# Patient Record
Sex: Male | Born: 1955 | Race: White | Hispanic: No | Marital: Married | Smoking: Former smoker
Health system: Southern US, Community
[De-identification: ages and names within clinical notes are randomized; demographics above are authoritative.]

## PROBLEM LIST (undated history)

## (undated) DIAGNOSIS — Z8781 Personal history of (healed) traumatic fracture: Secondary | ICD-10-CM

## (undated) DIAGNOSIS — I2510 Atherosclerotic heart disease of native coronary artery without angina pectoris: Secondary | ICD-10-CM

## (undated) DIAGNOSIS — K219 Gastro-esophageal reflux disease without esophagitis: Secondary | ICD-10-CM

## (undated) DIAGNOSIS — Z8719 Personal history of other diseases of the digestive system: Secondary | ICD-10-CM

## (undated) DIAGNOSIS — K760 Fatty (change of) liver, not elsewhere classified: Secondary | ICD-10-CM

## (undated) DIAGNOSIS — I10 Essential (primary) hypertension: Secondary | ICD-10-CM

## (undated) DIAGNOSIS — Z973 Presence of spectacles and contact lenses: Secondary | ICD-10-CM

## (undated) DIAGNOSIS — E7800 Pure hypercholesterolemia, unspecified: Secondary | ICD-10-CM

## (undated) DIAGNOSIS — N400 Enlarged prostate without lower urinary tract symptoms: Secondary | ICD-10-CM

## (undated) HISTORY — PX: TOTAL HIP REVISION: SHX763

## (undated) HISTORY — PX: HIP FRACTURE SURGERY: SHX118

## (undated) HISTORY — PX: COLONOSCOPY: SHX174

## (undated) HISTORY — PX: UPPER GI ENDOSCOPY: SHX6162

## (undated) HISTORY — PX: CORONARY ANGIOPLASTY WITH STENT PLACEMENT: SHX49

## (undated) HISTORY — PX: TOTAL HIP ARTHROPLASTY: SHX124

## (undated) HISTORY — PX: BONE GRAFT HIP ILIAC CREST: SUR159

## (undated) HISTORY — PX: APPENDECTOMY: SHX54

---
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
## 1995-11-08 DIAGNOSIS — I2510 Atherosclerotic heart disease of native coronary artery without angina pectoris: Secondary | ICD-10-CM

## 1995-11-08 HISTORY — DX: Atherosclerotic heart disease of native coronary artery without angina pectoris: I25.10

## 1999-03-13 ENCOUNTER — Emergency Department (HOSPITAL_COMMUNITY): Admission: EM | Admit: 1999-03-13 | Discharge: 1999-03-13 | Payer: Self-pay | Admitting: Emergency Medicine

## 1999-03-14 ENCOUNTER — Encounter: Payer: Self-pay | Admitting: Emergency Medicine

## 1999-04-05 ENCOUNTER — Encounter: Payer: Self-pay | Admitting: Pulmonary Disease

## 1999-04-05 ENCOUNTER — Ambulatory Visit (HOSPITAL_COMMUNITY): Admission: RE | Admit: 1999-04-05 | Discharge: 1999-04-05 | Payer: Self-pay | Admitting: Pulmonary Disease

## 1999-07-14 ENCOUNTER — Ambulatory Visit (HOSPITAL_COMMUNITY): Admission: RE | Admit: 1999-07-14 | Discharge: 1999-07-14 | Payer: Self-pay | Admitting: Pulmonary Disease

## 1999-07-14 ENCOUNTER — Encounter: Payer: Self-pay | Admitting: Pulmonary Disease

## 2006-07-18 ENCOUNTER — Inpatient Hospital Stay (HOSPITAL_COMMUNITY): Admission: RE | Admit: 2006-07-18 | Discharge: 2006-07-20 | Payer: Self-pay | Admitting: Orthopedic Surgery

## 2007-02-22 ENCOUNTER — Encounter: Admission: RE | Admit: 2007-02-22 | Discharge: 2007-02-22 | Payer: Self-pay | Admitting: Internal Medicine

## 2007-02-22 DIAGNOSIS — K760 Fatty (change of) liver, not elsewhere classified: Secondary | ICD-10-CM

## 2007-02-22 HISTORY — DX: Fatty (change of) liver, not elsewhere classified: K76.0

## 2007-07-24 ENCOUNTER — Encounter (INDEPENDENT_AMBULATORY_CARE_PROVIDER_SITE_OTHER): Payer: Self-pay | Admitting: Orthopedic Surgery

## 2007-07-24 ENCOUNTER — Inpatient Hospital Stay (HOSPITAL_COMMUNITY): Admission: RE | Admit: 2007-07-24 | Discharge: 2007-07-27 | Payer: Self-pay | Admitting: Orthopedic Surgery

## 2007-10-05 ENCOUNTER — Encounter: Admission: RE | Admit: 2007-10-05 | Discharge: 2007-10-05 | Payer: Self-pay | Admitting: Orthopedic Surgery

## 2008-03-20 ENCOUNTER — Encounter: Admission: RE | Admit: 2008-03-20 | Discharge: 2008-03-20 | Payer: Self-pay | Admitting: Orthopedic Surgery

## 2008-04-22 ENCOUNTER — Encounter: Admission: RE | Admit: 2008-04-22 | Discharge: 2008-04-22 | Payer: Self-pay | Admitting: Orthopedic Surgery

## 2009-07-16 ENCOUNTER — Encounter: Admission: RE | Admit: 2009-07-16 | Discharge: 2009-07-16 | Payer: Self-pay | Admitting: Orthopedic Surgery

## 2009-07-23 ENCOUNTER — Ambulatory Visit (HOSPITAL_COMMUNITY): Admission: RE | Admit: 2009-07-23 | Discharge: 2009-07-23 | Payer: Self-pay | Admitting: Orthopedic Surgery

## 2010-05-27 ENCOUNTER — Ambulatory Visit (HOSPITAL_COMMUNITY): Admission: RE | Admit: 2010-05-27 | Discharge: 2010-05-27 | Payer: Self-pay | Admitting: Orthopedic Surgery

## 2010-09-17 IMAGING — NM NM BONE 3 PHASE
1 series · 6 of 6 positions shown · non-contrast
Comparison: Prior nuclear medicine whole-body bone scan 07/23/2009
and prior CT scan 07/16/2009.

CLINICAL DATA: Evaluate right femur fracture.

NUCLEAR MEDICINE THREE PHASE BONE SCAN
TECHNIQUE: Radionuclide angiographic images, immediate static
blood pool images, and 3-hour delayed static images were obtained
after intravenous injection of radiopharmaceutical.
Radiopharmaceutical: 24 mCi technetium MDP.

[Series 1: fl flow and static · 4.75mm/px · 6 of 40 frames shown]
[frame 4/40]
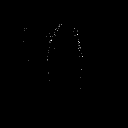
[frame 10/40  full-range]
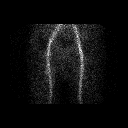
[frame 17/40  full-range]
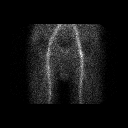
[frame 24/40  full-range]
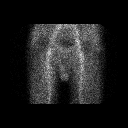
[frame 30/40  full-range]
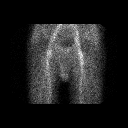
[frame 37/40  full-range]
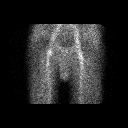

[6 of 6 positions shown; findings below may reference images not displayed]

FINDINGS: No increased blood flow is seen to the right hip/ lower
extremity.  The delayed images demonstrate stable mild increased
activity around the long stem femoral prosthesis and cerclage
wires.  Distally was also stable activity near the tip of the
prosthesis.  These all likely ongoing stress-related/remodeling
findings.  No discrete fracture.
IMPRESSION: Stable three-phase bone scan.  No findings to suggest infection.
Likely ongoing stress-related/remodeling changes.

## 2010-12-16 ENCOUNTER — Other Ambulatory Visit: Payer: Self-pay | Admitting: Gastroenterology

## 2011-03-22 NOTE — Discharge Summary (Signed)
NAME:  James Koch, James Koch               ACCOUNT NO.:  [id number]   MEDICAL RECORD NO.:  [id number]          PATIENT TYPE:  INP   LOCATION:  1612                         FACILITY:  [facility]   PHYSICIAN:  Deidre Ala, M.D.    DATE OF BIRTH:  1956-07-10   DATE OF ADMISSION:  07/24/2007  DATE OF DISCHARGE:  07/27/2007                               DISCHARGE SUMMARY   FINAL DIAGNOSES:  1. Osteoporosis, right hip.  2. Avascular necrosis, right hip.  3. Hyperlipidemia.  4. Hypertension.  5. Coronary artery disease.   PROCEDURES:  Total hip arthroplasty after removal of ORIF hardware.   HISTORY:  This is a 55 year old white male with history of genetic  osteoporosis.  He had a fracture of his right hip [place] and underwent ORIF of this.  He since has developed AVN of the  right hip and subsequently needed a total hip arthroplasty.  He  subsequently was scheduled for this.  We scheduled him for removal of  the old hardware from the ORIF and then total hip arthroplasty with an  SROM DePuy right hip prosthesis.   HOSPITAL COURSE:  Patient was admitted on July 24, 2007, and at  that time, underwent total hip arthroplasty with removal of the old  hardware from his ORIF.  The patient tolerated the procedure well, no  intraoperative complications.  Postoperatively, the patient did well, no  untoward event occurred during his stay.  He did have blood-loss anemia  noted.  He remained afebrile.  He progressed through therapy  excellently, without any difficulties.  He had no problems with this.  His incision remained clean and dry, no sign of infection.  He had no  signs for DVT.  He did finally require some blood products.  He received  two units of packed red cell prior to his discharge because of his  hemoglobin, which was 7.4, with a hematocrit of 20.9 on the date of  discharge.  He was doing well otherwise, tolerating this for the most  part.  He had a little bit of  light-headedness.  He was not tachycardic.  He was otherwise stable.  He subsequently received his two units of  packed red cells and was ready for discharge.   At the time of discharge, patient will continue on his home medications  he was taking prior to admission.  He was on:   1. Micardis 20 mg every other day.  2. Prilosec over-the-counter one daily.  3. Crestor 5 mg daily.  4. Forteo one injection daily.  5. He would also continue on Lovenox 40 mg subcu daily for the next      ten days.  6. He was given Percocet 5/325 one or two p.o. q. 4-6 h. p.r.n. for      pain, #50 of these without refills.   Patient has ALLERGY TO IBUPROFEN, which causes hives.   He was subsequently discharged on the afternoon of September 19 to home,  in satisfactory stable condition.  He will follow with Dr. Renae Fickle in 12  days.  James Koch, P.A.    ______________________________  James Koch, M.D.    CL/MEDQ  D:  07/27/2007  T:  07/27/2007  Job:  [id number]

## 2011-03-22 NOTE — Op Note (Signed)
NAME:  ARSHDEEP, BOLGER               ACCOUNT NO.:  [id number]   MEDICAL RECORD NO.:  [id number]          PATIENT TYPE:  INP   LOCATION:  NA                           FACILITY:  [facility]   PHYSICIAN:  Deidre Ala, M.D.    DATE OF BIRTH:  Feb 19, 1956   DATE OF PROCEDURE:  07/24/2007  DATE OF DISCHARGE:                               OPERATIVE REPORT   PREOPERATIVE DIAGNOSIS:  Status post prophylactic screw and side plate  with two Ace cannulated screws for hereditary osteoporosis severe and  femoral neck stress fracture healed, now with avascular necrosis of the  right femoral head and crescent sign.   POSTOPERATIVE DIAGNOSIS:  Status post prophylactic screw and side plate  with two Ace cannulated screws for hereditary osteoporosis severe and  femoral neck stress fracture healed, now with avascular necrosis of the  right femoral head and crescent sign.   PROCEDURES:  1. Right hip conversion of previous surgery to total hip arthroplasty      with removal of Ace DHS screw and side plate and two Ace cannulated      screws.  2. Right total hip arthroplasty using SROM total hip with metal-on-      metal acetabulum DePuy type cemented with long revision stem.   SURGEON:  1. Charlesetta Shanks, M.D.   ASSISTANT:  Phineas Semen, P.A.C.   ANESTHESIA:  General endotracheal.   CULTURES:  None.   DRAINS:  Two medium Hemovacs to self-suction.   ESTIMATED BLOOD LOSS:  1000 mL.   REPLACED:  Without.   PATHOLOGIC FINDINGS AND HISTORY:  The patient was sent by Janae Bridgeman.  Lendell Caprice, M.D., a year ago for right hip pain following walking on the  beach with the production of a vertical nondisplaced right femoral neck  stress fracture, who was prematurely osteopenic with familial  osteopenia.  He was taken to the operating room where the construct was  a DePuy compression screw and side plate, four-hole 130 degree angle  with two additional Ace 6.5 cannulated cancellous screws.  The patient  initially  healed uneventfully, but ultimately began to develop groin  pain.  He had some stress reaction at the distal plate but his pain was  basically 70% in the groin and about 30% down the leg which he could  live with.  It did not remit so we got an MRI scan which showed an area  of large AVN anteriorly with an area of old AVN more posteriorly with  acute edema.  There was also a crescent sign on the right hip.  There  was early AVN in the left hip.  Due to intractability of his pain, the  patient elected to proceed with total hip arthroplasty with metal  removal.  At surgery, we did remove the screw and side plate through the  same hip incision, but through a different incision more posterior to  the original screw placement more anterior on the femur.  We did  encounter some difficulty taking out the lag screw as it caught on the  sclerotic bone in the inferior calcar, and we stripped  the key mechanism  on the proximal screw, so we had to use a 14 mm trephine to remove it.  We also then removed two Ace cannulated cancellous screws as well as the  four screws on the side plate.  The actual total hip sizes were that we  reamed to a 56 acetabular cup.  We used the ASR all metal cup. Our SROM  proximal sleeve was an 18B large, the stem was an 18 x 13 x 215, 36  standard neck +8 lateral offset.  The femoral head was a 49 ball and the  taper sleeve adapter was a +9.  This gave [place] the best stability with no  push-pull instability, external rotation and extension stable and  flexion to almost 90 degrees with internal rotation to 80-85 degrees  before the prosthesis perched and came out.  Leg lengths were roughly  equal at measurement on the table.  Therefore this was metal-on-metal.  We did the long stem femoral stem with clothes pin tip in order to go  past the area of the old side plate where there was somewhat of a stress  reaction and also passed a stress risers there to protect him from   fracture as well.  We used a long stem in order to also bypass with  stress down into the femur with a clothes pin tip so that it would  decrease the rigidity of the tip of the stem and prevent thigh pain or  stress reaction distal.  This was all decided preoperatively and the  patient had agreed with the concept and the procedure.   PROCEDURE:  With adequate anesthesia obtained using endotracheal  technique, 1 gram Ancef given IV prophylaxis and another one at implant,  the patient was placed in the left lateral decubitus position with the  right side up.  After standard prepping and draping, we made an Romilda Joy incision proximally curving up over the trochanter and then down  along the lateral trochanter about 2 inches posterior to the original  hip incision.  We went down on the femur with a long enough length to  get the side plate out.  Incision was deepened sharply with a knife and  hemostasis obtained using the Bovie electrocoagulator.  We then  dissected through the iliotibial band and placed the Charnley retractor.  I then found the side plate and incised down the vastus lateralis,  exposed the screws, removed all the screws under power and the  compression screw as well as the plate.  I then made the attempt to  remove the lag screw.  It came out about a centimeter before it caught.  We tried various methods thereafter after stripping the key mechanism  for the screwdriver and then used a 14 mm trephine from the [facility]  instrument set, cut over it, removed the screw.  We then removed both  Ace cancellous screws with a screwdriver and pliers for the posterior  one because we had caught it in the edge of the trephine and part of the  head was removed, this was removed but we had a flat spot that we could  used to pull the screw out.  With all metal removed, irrigation was  carried out.  The screw holes were debrided and then we closed the  vastus lateralis with running  locking #1 PDS.  Attention was then turned  back to the hip where retractors were placed.  The piriformis was  released and tagged  as well as the capsule, peeling it back medially.  We then dislocated the femoral head, cleared the posterior neck and then  placed retractors again.  The template was then used to cut the neck cut  horizontal using the center of the head as the base for the SROM and  made that cut and then cut out the anterior trochanter.  We then exposed  the acetabulum, used wing retractors and various other retractors with  doing a labrectomy and ultimately removed the vascular pannus within the  oval window.  I then reamed up to a 55 with good bleeding subcortical  bone exposed with the appropriate geometry with anteversion and  abduction at about 45 degrees.  We then sized to a 56.  We then  implanted the ASR cup with a good tight fit in the appropriate geometry  and impacted it down.  We then exposed the proximal femur.  We did the  lateralizing canal finder and trochanter reamer.  We then sequentially  reamed up with flexible reamers past the isthmus, reaming up to a 14.5  for the 13 stem.  We then placed the conical reamer and reamed that to  an 18B large to the index 0.42 mm just passed the tip of the trochanter  even though it was a 36 length.  We sank it down just a little bit for  clearance.  We then did the proximal sleeve ream, again fitting an 18B  large and placed the proximal sleeve trial in place as well as the long  stem prosthesis trial.  We then articulated the hip, first trying a 0  head, then a +6 and ultimately a +9 in the appropriate anteversion.  At  this point, we then implanted the implantable proximal sleeve, 18B large  in the appropriate position and impacted that.  We then placed down the  prosthesis in the appropriate anteversion about 15 degrees with a long  stem with the actual stem prosthesis and impacted that.  We then trialed  again with a  +6 head.  I felt like the +9 was better and so impacted  that on and articulated the hip through the range of motion with the  stability findings as listed above.  C-arm fluoroscopy was then used to  confirm the overall position of the stem, bypassing the distal tip of  the plate and the geometry of the proximal femur and the acetabulum.  When we were satisfied with the position, irrigation was carried out.  FloSeal was placed in the base of the wound.  We then repaired the  posterior capsule back with Ethibond sutures to the gluteus muscles and  proximal trochanter as well as the piriformis.  The Hemovac drains were  placed in the medial and lateral gutters and brought out distally.  The  wound was then closed on the iliotibial band and gluteus fascia with  interrupted figure-of-eight #1 Vicryl sutures up to the gluteus fascia  then it was run.  We then closed the subcu with 0-0 and 2-0 subcuticular  running stitches and the skin with staples.  We had placed FloSeal on  the wound prior to closure.  The Hemovacs were hooked up to self-  suction.  I made one stab hole to remove the lower screw on the plate.  We used that as one of the drain holes and further repaired it with  staples.  A bulky sterile compressive dressing was applied with Aquacel  dressing and the patient had  a knee immobilizer placed, was turned into  the supine position and taken to the PACU in satisfactory condition as  per routine postoperative management.           ______________________________  V. Charlesetta Shanks, M.D.     VEP/MEDQ  D:  07/24/2007  T:  07/24/2007  Job:  [id number]   cc:   Janae Bridgeman. Eloise Harman., M.D.  Fax: [phone]

## 2011-03-25 NOTE — Op Note (Signed)
NAME:  James Koch, James Koch               ACCOUNT NO.:  [id number]   MEDICAL RECORD NO.:  [id number]          PATIENT TYPE:  INP   LOCATION:  0010                         FACILITY:  [facility]   PHYSICIAN:  Deidre Ala, M.D.    DATE OF BIRTH:  May 11, 1956   DATE OF PROCEDURE:  07/18/2006  DATE OF DISCHARGE:                                 OPERATIVE REPORT   PREOPERATIVE DIAGNOSIS:  Vertical nondisplaced right hip femoral neck stress  fracture in a prematurely osteopenic male.   POSTOPERATIVE DIAGNOSIS:  Vertical nondisplaced right hip femoral neck  stress fracture in a prematurely osteopenic male.   OPERATION PERFORMED:  1. Open reduction internal fixation of vertical stress fracture, right      femoral neck using DePuy compression screw and side plate 4-hole 366      degree angle with two additional Ace 6.5 cannulated cancellous screws.  2. Use of intraoperative fluoroscopy.   SURGEON:  1. Charlesetta Shanks, M.D.   ASSISTANT:  Clarene Reamer, P.A.-C.   ANESTHESIA:  General endotracheal.   CULTURES:  None.   DRAINS:  None.   ESTIMATED BLOOD LOSS:  600 mL.   FLUIDS REPLACED:  Without.   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  He had a heart attack in his 30s.  He is a two-pack per day  smoker up until seven years ago and he has been relatively sedentary as an  Art gallery manager but walked at [employer], noticed hip pain.  He saw his regular  medical doctor, Janae Bridgeman. James Koch, M.D., who got an MRI scan showing a  vertical nondisplaced femoral neck fracture.  We then did a Dexascan on his  opposite hip in the office and it showed high risk for hip fracture.  Therefore, I felt he was out of the range for a in situ pinning, given the  fact that there is high incidence of fracture subpins, so we used a spanning  DHS type screw and side plate in the inferior head and neck as well as two  more superiorly placed Ace cannulated cancellous screws for fixation.   We  achieved our goal of the placement of the screws.   DESCRIPTION OF PROCEDURE:  With adequate anesthesia obtained using  endotracheal technique, 1 g Ancef given IV prophylaxis, the patient was  placed in the supine position on the [place] fracture table with the hip in  the internally rotated position, the left hip up in a stirrup.  Scout films  were taken.  After standard prepping and draping, we placed a guide pin in  the inferior slightly posterior head and neck with a DHS short thread screw.  We then measured the length of 100 mm and then tapped it, then placed a  cannulator step cut drill before we had done the tapping.  We then placed  the 100 mm screw and side plate, 440 degree angle.  Placed three bicortical  screws and one unicortical screw to fix the plate to the femur and then the  compression screw was put in place and left.  C-arm fluoroscopy confirmed  positioning.  We then placed two additional 6.5 cannulated cancellous screws  in the superior slightly divergent head and neck, with divertent position to  achieve geometric stability.  We did used a keyed type system on the  compression screw.  C-arm fluoroscopy confirmed positioning and we felt we  were satisfactorily across the fracture configuration.  It should be noted  that we did make an incision laterally.  Dissecting down through the vastus  lateralis and iliotibial band, placing retractors and then placing the screw  and side plate and the additional screws.  Irrigation was carried out, there  was an arterial pumper distally that we did finally cauterize and got good  hemostasis ultimately using some additional Gelfoam with Thrombin.  The  wound was then closed in layers with #1 running locking PDS on vastus  lateralis and iliotibial band, 0 and 2-0 Vicryl on the skin and skin  staples.  Bulky sterile compressive dressing was applied with adhesive  bandage.  The patient having tolerated the procedure well was  awakened and  taken to recovery room in satisfactory condition for routine postoperative  care.           ______________________________  V. Charlesetta Shanks, M.D.     VEP/MEDQ  D:  07/18/2006  T:  07/19/2006  Job:  [id number]   cc:   Janae Bridgeman. Eloise Harman., M.D.  Fax: [phone]

## 2011-08-18 LAB — CBC
HCT: 20.9 — ABNORMAL LOW
Hemoglobin: 7.4 — CL
MCV: 88.8
MCV: 89.2
MCV: 89.8
Platelets: 187
Platelets: 221
RBC: 2.33 — ABNORMAL LOW
RBC: 2.49 — ABNORMAL LOW
WBC: 10.2
WBC: 7.9
WBC: 9.4

## 2011-08-18 LAB — TYPE AND SCREEN
ABO/RH(D): A POS
Antibody Screen: NEGATIVE

## 2011-08-18 LAB — BASIC METABOLIC PANEL
CO2: 26
CO2: 28
Calcium: 8.1 — ABNORMAL LOW
Calcium: 8.6
Creatinine, Ser: 1.16
Sodium: 134 — ABNORMAL LOW

## 2011-08-18 LAB — HEMOGLOBIN AND HEMATOCRIT, BLOOD
HCT: 30.3 — ABNORMAL LOW
Hemoglobin: 10.6 — ABNORMAL LOW

## 2011-08-19 LAB — COMPREHENSIVE METABOLIC PANEL
ALT: 52
CO2: 26
Calcium: 9.9
Chloride: 105
Creatinine, Ser: 0.86
GFR calc non Af Amer: >60
Glucose, Bld: 105 — ABNORMAL HIGH
Total Bilirubin: 0.7

## 2011-08-19 LAB — DIFFERENTIAL
Basophils Absolute: 0
Basophils Relative: 1
Eosinophils Relative: 6 — ABNORMAL HIGH
Lymphocytes Relative: 16
Monocytes Absolute: 0.6
Neutro Abs: 4.2

## 2011-08-19 LAB — URINALYSIS, ROUTINE W REFLEX MICROSCOPIC
Bilirubin Urine: NEGATIVE
Glucose, UA: NEGATIVE
Specific Gravity, Urine: 1.017
pH: 6.5

## 2011-08-19 LAB — CBC
HCT: 41.8
MCV: 88.1
RBC: 4.74
WBC: 6.1

## 2011-08-19 LAB — APTT: aPTT: 25

## 2011-08-19 LAB — URINE MICROSCOPIC-ADD ON

## 2012-02-07 ENCOUNTER — Other Ambulatory Visit: Payer: Self-pay | Admitting: Occupational Medicine

## 2012-02-07 ENCOUNTER — Ambulatory Visit: Payer: Self-pay

## 2012-02-07 DIAGNOSIS — Z0000 Encounter for general adult medical examination without abnormal findings: Secondary | ICD-10-CM

## 2015-03-16 ENCOUNTER — Emergency Department (INDEPENDENT_AMBULATORY_CARE_PROVIDER_SITE_OTHER): Payer: Managed Care, Other (non HMO)

## 2015-03-16 ENCOUNTER — Encounter (HOSPITAL_COMMUNITY): Payer: Self-pay | Admitting: Emergency Medicine

## 2015-03-16 ENCOUNTER — Emergency Department (HOSPITAL_COMMUNITY)
Admission: EM | Admit: 2015-03-16 | Discharge: 2015-03-16 | Disposition: A | Discharge status: Home / Self Care | Payer: Managed Care, Other (non HMO) | Source: Home / Self Care | Attending: Family Medicine | Admitting: Family Medicine

## 2015-03-16 DIAGNOSIS — S61219A Laceration without foreign body of unspecified finger without damage to nail, initial encounter: Secondary | ICD-10-CM

## 2015-03-16 DIAGNOSIS — Z23 Encounter for immunization: Secondary | ICD-10-CM | POA: Diagnosis not present

## 2015-03-16 HISTORY — DX: Essential (primary) hypertension: I10

## 2015-03-16 HISTORY — DX: Pure hypercholesterolemia, unspecified: E78.00

## 2015-03-16 MED ORDER — CEPHALEXIN 500 MG PO CAPS: 500.0000 mg | ORAL_CAPSULE | Freq: Three times a day (TID) | ORAL | Status: DC

## 2015-03-16 MED ORDER — TETANUS-DIPHTH-ACELL PERTUSSIS 5-2.5-18.5 LF-MCG/0.5 IM SUSP
0.5000 mL | Freq: Once | INTRAMUSCULAR | Status: AC
  Administered 2015-03-16: 0.5 mL via INTRAMUSCULAR

## 2015-03-16 MED ORDER — TETANUS-DIPHTH-ACELL PERTUSSIS 5-2.5-18.5 LF-MCG/0.5 IM SUSP
INTRAMUSCULAR | Status: AC
  Filled 2015-03-16: qty 0.5

## 2015-03-16 NOTE — ED Provider Notes (Addendum)
James Koch is a 59 y.o. male who presents to Urgent Care today for finger laceration. Patient was in his normal state of health today when he suffered a laceration to the tips of his left third and fourth digits with a table saw. He notes bleeding from the fourth digit. He cannot recall his last tetanus vaccine. No fevers or chills nausea vomiting or diarrhea. He has tried compression a helped control the bleeding.   Past Medical History  Diagnosis Date  . Hypertension   . High cholesterol    Past Surgical History  Procedure Laterality Date  . Appendectomy    . Coronary angioplasty with stent placement     History  Substance Use Topics  . Smoking status: Never Smoker   . Smokeless tobacco: Not on file  . Alcohol Use: No   ROS as above Medications: No current facility-administered medications for this encounter.   Current Outpatient Prescriptions  Medication Sig Dispense Refill  . ezetimibe-simvastatin (VYTORIN) 10-40 MG per tablet Take 1 tablet by mouth daily.    Marland Kitchen losartan (COZAAR) 50 MG tablet Take 50 mg by mouth daily.    . cephALEXin (KEFLEX) 500 MG capsule Take 1 capsule (500 mg total) by mouth 3 (three) times daily. 21 capsule 0   No Known Allergies   Exam:  BP 144/92 mmHg  Pulse 93  Temp(Src) 98.2 F (36.8 C) (Oral)  Resp 16  SpO2 96%  Gen: Well NAD Left hand: Laceration to the fourth and third digits distally. The laceration involves the fourth ulnar nail and the tip of the finger. No bone fragments are visible. The third digit laceration is extremely superficial and does not penetrate through the dermis. Motion capillary refill and sensation are intact distally.  Laceration repair:  Consent obtained and timeout performed. The volar fourth MCP was cleaned with Betadine and 4 mL of lidocaine without epinephrine were injected to achieve a good digital block. The wound was copiously irrigated with sure cleanse solution A tourniquet was applied and the entire  finger was cleaned with Betadine The area was draped in the usual sterile fashion. 1 horizontal mattress suture and 2 small interrupted sutures were used to close the wound and achieve good hemostasis. 4-0 Prolene was used Tourniquet removed total tourniquet time was less than 5 minutes. Slight oozing from the wound without brisk bleeding. Betadine removed and a dressing applied.  No results found for this or any previous visit (from the past 24 hour(s)). Dg Finger Ring Left  03/16/2015   CLINICAL DATA:  Cup the tip of his ring finger with a table saw while making a cabinet for is wife.  EXAM: LEFT RING FINGER 2+V  COMPARISON:  None.  FINDINGS: Irregularity of the soft tissues of the distal tip of the ring finger. There is also a small, shallow defect in the distal aspect of the fourth distal tuft. There is a tiny bone fragment at that location dorsally.  IMPRESSION: Laceration of the distal aspect of the left ring finger with a minimal fracture of the underlying fourth distal tuft.   Electronically Signed   By: Claudie Revering M.D.   On: 03/16/2015 20:31    Assessment and Plan: 59 y.o. male with finger laceration and open fracture. Laceration repaired with Prolene. Will treat with Keflex antibiotics and provide tetanus vaccination prior to discharge. Return in one week for suture removal.  Discussed warning signs or symptoms. Please see discharge instructions. Patient expresses understanding.     Rebekah Chesterfield  Georgina Snell, MD 03/16/15 2039  Gregor Hams, MD 03/16/15 [phone]

## 2015-03-16 NOTE — ED Notes (Signed)
Cut fingers with table saw around 6:00 pm.  Cut to finger tip of left middle finger, left ring finger tip.  Continues bleeding.

## 2015-03-16 NOTE — Discharge Instructions (Signed)
Thank you for coming in today. Keep the wound covered with ointment.  Return in 1 week for suture removal.  Take Keflex 3 times daily for one week Laceration Care, Adult A laceration is a cut or lesion that goes through all layers of the skin and into the tissue just beneath the skin. TREATMENT  Some lacerations may not require closure. Some lacerations may not be able to be closed due to an increased risk of infection. It is important to see your caregiver as soon as possible after an injury to minimize the risk of infection and maximize the opportunity for successful closure. If closure is appropriate, pain medicines may be given, if needed. The wound will be cleaned to help prevent infection. Your caregiver will use stitches (sutures), staples, wound glue (adhesive), or skin adhesive strips to repair the laceration. These tools bring the skin edges together to allow for faster healing and a better cosmetic outcome. However, all wounds will heal with a scar. Once the wound has healed, scarring can be minimized by covering the wound with sunscreen during the day for 1 full year. HOME CARE INSTRUCTIONS  For sutures or staples:  Keep the wound clean and dry.  If you were given a bandage (dressing), you should change it at least once a day. Also, change the dressing if it becomes wet or dirty, or as directed by your caregiver.  Wash the wound with soap and water 2 times a day. Rinse the wound off with water to remove all soap. Pat the wound dry with a clean towel.  After cleaning, apply a thin layer of the antibiotic ointment as recommended by your caregiver. This will help prevent infection and keep the dressing from sticking.  You may shower as usual after the first 24 hours. Do not soak the wound in water until the sutures are removed.  Only take over-the-counter or prescription medicines for pain, discomfort, or fever as directed by your caregiver.  Get your sutures or staples removed as  directed by your caregiver. For skin adhesive strips:  Keep the wound clean and dry.  Do not get the skin adhesive strips wet. You may bathe carefully, using caution to keep the wound dry.  If the wound gets wet, pat it dry with a clean towel.  Skin adhesive strips will fall off on their own. You may trim the strips as the wound heals. Do not remove skin adhesive strips that are still stuck to the wound. They will fall off in time. For wound adhesive:  You may briefly wet your wound in the shower or bath. Do not soak or scrub the wound. Do not swim. Avoid periods of heavy perspiration until the skin adhesive has fallen off on its own. After showering or bathing, gently pat the wound dry with a clean towel.  Do not apply liquid medicine, cream medicine, or ointment medicine to your wound while the skin adhesive is in place. This may loosen the film before your wound is healed.  If a dressing is placed over the wound, be careful not to apply tape directly over the skin adhesive. This may cause the adhesive to be pulled off before the wound is healed.  Avoid prolonged exposure to sunlight or tanning lamps while the skin adhesive is in place. Exposure to ultraviolet light in the first year will darken the scar.  The skin adhesive will usually remain in place for 5 to 10 days, then naturally fall off the skin. Do not pick  at the adhesive film. You may need a tetanus shot if:  You cannot remember when you had your last tetanus shot.  You have never had a tetanus shot. If you get a tetanus shot, your arm may swell, get red, and feel warm to the touch. This is common and not a problem. If you need a tetanus shot and you choose not to have one, there is a rare chance of getting tetanus. Sickness from tetanus can be serious. SEEK MEDICAL CARE IF:   You have redness, swelling, or increasing pain in the wound.  You see a red line that goes away from the wound.  You have yellowish-white fluid  (pus) coming from the wound.  You have a fever.  You notice a bad smell coming from the wound or dressing.  Your wound breaks open before or after sutures have been removed.  You notice something coming out of the wound such as wood or glass.  Your wound is on your hand or foot and you cannot move a finger or toe. SEEK IMMEDIATE MEDICAL CARE IF:   Your pain is not controlled with prescribed medicine.  You have severe swelling around the wound causing pain and numbness or a change in color in your arm, hand, leg, or foot.  Your wound splits open and starts bleeding.  You have worsening numbness, weakness, or loss of function of any joint around or beyond the wound.  You develop painful lumps near the wound or on the skin anywhere on your body. MAKE SURE YOU:   Understand these instructions.  Will watch your condition.  Will get help right away if you are not doing well or get worse. Document Released: 10/24/2005 Document Revised: 01/16/2012 Document Reviewed: 04/19/2011 [facility], [place] Patient Information 2015 [place], [place]. This information is not intended to replace advice given to you by your health care provider. Make sure you discuss any questions you have with your health care provider.

## 2018-10-17 ENCOUNTER — Other Ambulatory Visit: Payer: Self-pay | Admitting: Urology

## 2018-11-29 ENCOUNTER — Encounter (HOSPITAL_BASED_OUTPATIENT_CLINIC_OR_DEPARTMENT_OTHER): Payer: Self-pay

## 2018-11-29 ENCOUNTER — Other Ambulatory Visit: Payer: Self-pay

## 2018-11-29 NOTE — Progress Notes (Signed)
Spoke with:  Shanon Brow NPO:  After Midnight, no gum, candy, or mints   Arrival time:  0530AM Labs: Istat 4, EKG AM medications: None Pre op orders: Yes Ride home:  Bethena Roys (wife) [phone]

## 2018-12-06 NOTE — Anesthesia Preprocedure Evaluation (Addendum)
Anesthesia Evaluation  Patient identified by MRN, date of birth, ID band Patient awake    Reviewed: Allergy & Precautions, NPO status , Patient's Chart, lab work & pertinent test results  History of Anesthesia Complications Negative for: history of anesthetic complications  Airway Mallampati: IV  TM Distance: >3 FB Neck ROM: Full    Dental  (+) Dental Advisory Given, Teeth Intact   Pulmonary former smoker,    breath sounds clear to auscultation       Cardiovascular Exercise Tolerance: Good hypertension, Pt. on medications (-) angina+ CAD and + Cardiac Stents   Rhythm:Regular Rate:Normal     Neuro/Psych negative neurological ROS  negative psych ROS   GI/Hepatic Neg liver ROS, GERD  Medicated,  Endo/Other  negative endocrine ROS  Renal/GU negative Renal ROS     Musculoskeletal negative musculoskeletal ROS (+)   Abdominal   Peds  Hematology negative hematology ROS (+)   Anesthesia Other Findings   Reproductive/Obstetrics                            Anesthesia Physical Anesthesia Plan  ASA: III  Anesthesia Plan: General   Post-op Pain Management:    Induction: Intravenous  PONV Risk Score and Plan: 2 and Treatment may vary due to age or medical condition and Ondansetron  Airway Management Planned: LMA  Additional Equipment: None  Intra-op Plan:   Post-operative Plan: Extubation in OR  Informed Consent: I have reviewed the patients History and Physical, chart, labs and discussed the procedure including the risks, benefits and alternatives for the proposed anesthesia with the patient or authorized representative who has indicated his/her understanding and acceptance.     Dental advisory given  Plan Discussed with: CRNA and Anesthesiologist  Anesthesia Plan Comments:        Anesthesia Quick Evaluation

## 2018-12-07 ENCOUNTER — Ambulatory Visit (HOSPITAL_BASED_OUTPATIENT_CLINIC_OR_DEPARTMENT_OTHER): Payer: No Typology Code available for payment source | Admitting: Anesthesiology

## 2018-12-07 ENCOUNTER — Encounter (HOSPITAL_BASED_OUTPATIENT_CLINIC_OR_DEPARTMENT_OTHER)
Admission: RE | Disposition: A | Discharge status: Home / Self Care | Payer: Self-pay | Source: Home / Self Care | Attending: Urology

## 2018-12-07 ENCOUNTER — Other Ambulatory Visit: Payer: Self-pay

## 2018-12-07 ENCOUNTER — Encounter (HOSPITAL_BASED_OUTPATIENT_CLINIC_OR_DEPARTMENT_OTHER): Payer: Self-pay | Admitting: *Deleted

## 2018-12-07 ENCOUNTER — Ambulatory Visit (HOSPITAL_BASED_OUTPATIENT_CLINIC_OR_DEPARTMENT_OTHER)
Admission: RE | Admit: 2018-12-07 | Discharge: 2018-12-07 | Disposition: A | Discharge status: Home / Self Care | Payer: No Typology Code available for payment source | Attending: Urology | Admitting: Urology

## 2018-12-07 DIAGNOSIS — R3911 Hesitancy of micturition: Secondary | ICD-10-CM | POA: Insufficient documentation

## 2018-12-07 DIAGNOSIS — Z79899 Other long term (current) drug therapy: Secondary | ICD-10-CM | POA: Insufficient documentation

## 2018-12-07 DIAGNOSIS — R3912 Poor urinary stream: Secondary | ICD-10-CM | POA: Insufficient documentation

## 2018-12-07 DIAGNOSIS — Z955 Presence of coronary angioplasty implant and graft: Secondary | ICD-10-CM | POA: Insufficient documentation

## 2018-12-07 DIAGNOSIS — N401 Enlarged prostate with lower urinary tract symptoms: Secondary | ICD-10-CM | POA: Diagnosis not present

## 2018-12-07 DIAGNOSIS — N138 Other obstructive and reflux uropathy: Secondary | ICD-10-CM | POA: Diagnosis not present

## 2018-12-07 DIAGNOSIS — K219 Gastro-esophageal reflux disease without esophagitis: Secondary | ICD-10-CM | POA: Insufficient documentation

## 2018-12-07 DIAGNOSIS — K760 Fatty (change of) liver, not elsewhere classified: Secondary | ICD-10-CM | POA: Insufficient documentation

## 2018-12-07 DIAGNOSIS — I2510 Atherosclerotic heart disease of native coronary artery without angina pectoris: Secondary | ICD-10-CM | POA: Insufficient documentation

## 2018-12-07 DIAGNOSIS — I10 Essential (primary) hypertension: Secondary | ICD-10-CM | POA: Insufficient documentation

## 2018-12-07 DIAGNOSIS — Z96641 Presence of right artificial hip joint: Secondary | ICD-10-CM | POA: Diagnosis not present

## 2018-12-07 DIAGNOSIS — Z91013 Allergy to seafood: Secondary | ICD-10-CM | POA: Insufficient documentation

## 2018-12-07 DIAGNOSIS — E7800 Pure hypercholesterolemia, unspecified: Secondary | ICD-10-CM | POA: Diagnosis not present

## 2018-12-07 HISTORY — DX: Gastro-esophageal reflux disease without esophagitis: K21.9

## 2018-12-07 HISTORY — DX: Presence of spectacles and contact lenses: Z97.3

## 2018-12-07 HISTORY — DX: Personal history of (healed) traumatic fracture: Z87.81

## 2018-12-07 HISTORY — DX: Fatty (change of) liver, not elsewhere classified: K76.0

## 2018-12-07 HISTORY — DX: Benign prostatic hyperplasia without lower urinary tract symptoms: N40.0

## 2018-12-07 HISTORY — DX: Personal history of other diseases of the digestive system: Z87.19

## 2018-12-07 HISTORY — DX: Atherosclerotic heart disease of native coronary artery without angina pectoris: I25.10

## 2018-12-07 HISTORY — PX: CYSTOSCOPY WITH INSERTION OF UROLIFT: SHX6678

## 2018-12-07 LAB — POCT I-STAT 4, (NA,K, GLUC, HGB,HCT)
Glucose, Bld: 94 mg/dL (ref 70–99)
HCT: 49 % (ref 39.0–52.0)
Hemoglobin: 16.7 g/dL (ref 13.0–17.0)
Potassium: 4.3 mmol/L (ref 3.5–5.1)
Sodium: 141 mmol/L (ref 135–145)

## 2018-12-07 SURGERY — CYSTOSCOPY WITH INSERTION OF UROLIFT
Anesthesia: General | Site: Bladder

## 2018-12-07 MED ORDER — KETOROLAC TROMETHAMINE 30 MG/ML IJ SOLN
INTRAMUSCULAR | Status: AC
  Filled 2018-12-07: qty 1

## 2018-12-07 MED ORDER — FENTANYL CITRATE (PF) 100 MCG/2ML IJ SOLN
INTRAMUSCULAR | Status: AC
  Filled 2018-12-07: qty 2

## 2018-12-07 MED ORDER — STERILE WATER FOR IRRIGATION IR SOLN
Status: DC | PRN
  Administered 2018-12-07: 3000 mL

## 2018-12-07 MED ORDER — LIDOCAINE 2% (20 MG/ML) 5 ML SYRINGE
INTRAMUSCULAR | Status: DC | PRN
  Administered 2018-12-07: 100 mg via INTRAVENOUS

## 2018-12-07 MED ORDER — MIDAZOLAM HCL 2 MG/2ML IJ SOLN
INTRAMUSCULAR | Status: AC
  Filled 2018-12-07: qty 2

## 2018-12-07 MED ORDER — FENTANYL CITRATE (PF) 100 MCG/2ML IJ SOLN
25.0000 ug | INTRAMUSCULAR | Status: DC | PRN
  Administered 2018-12-07 (×2): 50 ug via INTRAVENOUS
  Filled 2018-12-07: qty 1

## 2018-12-07 MED ORDER — LIDOCAINE 2% (20 MG/ML) 5 ML SYRINGE
INTRAMUSCULAR | Status: AC
  Filled 2018-12-07: qty 5

## 2018-12-07 MED ORDER — DEXAMETHASONE SODIUM PHOSPHATE 10 MG/ML IJ SOLN
INTRAMUSCULAR | Status: AC
  Filled 2018-12-07: qty 1

## 2018-12-07 MED ORDER — FENTANYL CITRATE (PF) 100 MCG/2ML IJ SOLN
INTRAMUSCULAR | Status: DC | PRN
  Administered 2018-12-07 (×2): 50 ug via INTRAVENOUS

## 2018-12-07 MED ORDER — CEFAZOLIN SODIUM-DEXTROSE 2-4 GM/100ML-% IV SOLN
2.0000 g | Freq: Once | INTRAVENOUS | Status: AC
  Administered 2018-12-07: 2 g via INTRAVENOUS
  Filled 2018-12-07: qty 100

## 2018-12-07 MED ORDER — PHENYLEPHRINE 40 MCG/ML (10ML) SYRINGE FOR IV PUSH (FOR BLOOD PRESSURE SUPPORT)
PREFILLED_SYRINGE | INTRAVENOUS | Status: AC
  Filled 2018-12-07: qty 10

## 2018-12-07 MED ORDER — CEFAZOLIN SODIUM-DEXTROSE 2-4 GM/100ML-% IV SOLN
INTRAVENOUS | Status: AC
  Filled 2018-12-07: qty 100

## 2018-12-07 MED ORDER — PROPOFOL 10 MG/ML IV BOLUS
INTRAVENOUS | Status: AC
  Filled 2018-12-07: qty 40

## 2018-12-07 MED ORDER — ONDANSETRON HCL 4 MG/2ML IJ SOLN
INTRAMUSCULAR | Status: DC | PRN
  Administered 2018-12-07: 4 mg via INTRAVENOUS

## 2018-12-07 MED ORDER — DEXAMETHASONE SODIUM PHOSPHATE 10 MG/ML IJ SOLN
INTRAMUSCULAR | Status: DC | PRN
  Administered 2018-12-07: 10 mg via INTRAVENOUS

## 2018-12-07 MED ORDER — OXYCODONE HCL 5 MG PO TABS
5.0000 mg | ORAL_TABLET | Freq: Once | ORAL | Status: AC | PRN
  Administered 2018-12-07: 5 mg via ORAL
  Filled 2018-12-07: qty 1

## 2018-12-07 MED ORDER — PHENYLEPHRINE 40 MCG/ML (10ML) SYRINGE FOR IV PUSH (FOR BLOOD PRESSURE SUPPORT)
PREFILLED_SYRINGE | INTRAVENOUS | Status: DC | PRN
  Administered 2018-12-07: 80 ug via INTRAVENOUS

## 2018-12-07 MED ORDER — SULFAMETHOXAZOLE-TRIMETHOPRIM 800-160 MG PO TABS: 1.0000 | ORAL_TABLET | Freq: Every day | ORAL | 0 refills | Status: DC

## 2018-12-07 MED ORDER — MIDAZOLAM HCL 5 MG/5ML IJ SOLN
INTRAMUSCULAR | Status: DC | PRN
  Administered 2018-12-07: 2 mg via INTRAVENOUS

## 2018-12-07 MED ORDER — OXYCODONE HCL 5 MG PO TABS
ORAL_TABLET | ORAL | Status: AC
  Filled 2018-12-07: qty 1

## 2018-12-07 MED ORDER — PROMETHAZINE HCL 25 MG/ML IJ SOLN
6.2500 mg | INTRAMUSCULAR | Status: DC | PRN
  Filled 2018-12-07: qty 1

## 2018-12-07 MED ORDER — OXYCODONE HCL 5 MG/5ML PO SOLN
5.0000 mg | Freq: Once | ORAL | Status: AC | PRN
  Filled 2018-12-07: qty 5

## 2018-12-07 MED ORDER — PROPOFOL 10 MG/ML IV BOLUS
INTRAVENOUS | Status: DC | PRN
  Administered 2018-12-07: 200 mg via INTRAVENOUS

## 2018-12-07 MED ORDER — LACTATED RINGERS IV SOLN
INTRAVENOUS | Status: DC
  Administered 2018-12-07 (×2): via INTRAVENOUS
  Filled 2018-12-07: qty 1000

## 2018-12-07 MED ORDER — ONDANSETRON HCL 4 MG/2ML IJ SOLN
INTRAMUSCULAR | Status: AC
  Filled 2018-12-07: qty 2

## 2018-12-07 MED ORDER — KETOROLAC TROMETHAMINE 30 MG/ML IJ SOLN
INTRAMUSCULAR | Status: DC | PRN
  Administered 2018-12-07: 30 mg via INTRAVENOUS

## 2018-12-07 SURGICAL SUPPLY — 30 items
BAG DRAIN URO-CYSTO SKYTR STRL (DRAIN) ×2 IMPLANT
BAG DRN ANRFLXCHMBR STRAP LEK (BAG)
BAG DRN UROCATH (DRAIN) ×1
BAG URINE DRAINAGE (UROLOGICAL SUPPLIES) ×1 IMPLANT
BAG URINE LEG 19OZ MD ST LTX (BAG) IMPLANT
BAG URINE LEG 500ML (DRAIN) IMPLANT
CATH COUDE FOLEY 2W 5CC 18FR (CATHETERS) IMPLANT
CATH FOLEY 2WAY SLVR  5CC 16FR (CATHETERS) ×1
CATH FOLEY 2WAY SLVR  5CC 18FR (CATHETERS)
CATH FOLEY 2WAY SLVR 5CC 16FR (CATHETERS) IMPLANT
CATH FOLEY 2WAY SLVR 5CC 18FR (CATHETERS) IMPLANT
CLOTH BEACON ORANGE TIMEOUT ST (SAFETY) ×2 IMPLANT
ELECT REM PT RETURN 9FT ADLT (ELECTROSURGICAL) ×2
ELECTRODE REM PT RTRN 9FT ADLT (ELECTROSURGICAL) ×1 IMPLANT
GLOVE BIO SURGEON STRL SZ7.5 (GLOVE) ×2 IMPLANT
GLOVE BIOGEL PI IND STRL 6.5 (GLOVE) IMPLANT
GLOVE BIOGEL PI INDICATOR 6.5 (GLOVE) ×1
GLOVE SURG SS PI 6.5 STRL IVOR (GLOVE) ×1 IMPLANT
GOWN STRL REUS W/ TWL XL LVL3 (GOWN DISPOSABLE) ×1 IMPLANT
GOWN STRL REUS W/TWL LRG LVL3 (GOWN DISPOSABLE) ×1 IMPLANT
GOWN STRL REUS W/TWL XL LVL3 (GOWN DISPOSABLE) ×2
HOLDER FOLEY CATH W/STRAP (MISCELLANEOUS) ×1 IMPLANT
KIT TURNOVER CYSTO (KITS) ×2 IMPLANT
MANIFOLD NEPTUNE II (INSTRUMENTS) IMPLANT
NEEDLE HYPO 22GX1.5 SAFETY (NEEDLE) IMPLANT
NS IRRIG 500ML POUR BTL (IV SOLUTION) IMPLANT
PACK CYSTO (CUSTOM PROCEDURE TRAY) ×2 IMPLANT
SYSTEM UROLIFT (Male Continence) ×4 IMPLANT
TUBE CONNECTING 12X1/4 (SUCTIONS) ×1 IMPLANT
WATER STERILE IRR 3000ML UROMA (IV SOLUTION) ×2 IMPLANT

## 2018-12-07 NOTE — Anesthesia Postprocedure Evaluation (Signed)
Anesthesia Post Note  Patient: James Koch  Procedure(s) Performed: CYSTOSCOPY WITH INSERTION OF UROLIFT (N/A Bladder)     Patient location during evaluation: PACU Anesthesia Type: General Level of consciousness: awake and alert Pain management: pain level controlled Vital Signs Assessment: post-procedure vital signs reviewed and stable Respiratory status: spontaneous breathing, nonlabored ventilation and respiratory function stable Cardiovascular status: blood pressure returned to baseline and stable Postop Assessment: no apparent nausea or vomiting Anesthetic complications: no    Last Vitals:  Vitals:   12/07/18 0830 12/07/18 0845  BP: 113/68 116/74  Pulse: 94 88  Resp: 17 11  Temp:    SpO2: 98% 97%                  Audry Pili

## 2018-12-07 NOTE — Transfer of Care (Signed)
Immediate Anesthesia Transfer of Care Note  Patient: James Koch  Procedure(s) Performed: CYSTOSCOPY WITH INSERTION OF UROLIFT (N/A Bladder)  Patient Location: PACU  Anesthesia Type:General  Level of Consciousness: awake, alert  and oriented  Airway & Oxygen Therapy: Patient Spontanous Breathing and Patient connected to nasal cannula oxygen  Post-op Assessment: Report given to RN  Post vital signs: Reviewed and stable  Last Vitals:  Vitals Value Taken Time  BP 120/81 12/07/2018  8:13 AM  Temp    Pulse 84 12/07/2018  8:15 AM  Resp 13 12/07/2018  8:15 AM  SpO2 97 % 12/07/2018  8:15 AM  Vitals shown include unvalidated device data.  Last Pain:  Vitals:   12/07/18 0552  TempSrc:   PainSc: 0-No pain      Patients Stated Pain Goal: 6 (16/10/96 0454)  Complications: No apparent anesthesia complications

## 2018-12-07 NOTE — Discharge Instructions (Signed)
Indwelling Urinary Catheter Care, Adult An indwelling urinary catheter is a thin tube that is put into your bladder. The tube helps to drain pee (urine) out of your body. The tube goes in through your urethra. Your urethra is where pee comes out of your body. Your pee will come out through the catheter, then it will go into a bag (drainage bag). Take good care of your catheter so it will work well. How to wear your catheter and bag Supplies needed  Sticky tape (adhesive tape) or a leg strap.  Alcohol wipe or soap and water (if you use tape).  A clean towel (if you use tape).  Large overnight bag.  Smaller bag (leg bag). Wearing your catheter Attach your catheter to your leg with tape or a leg strap.  Make sure the catheter is not pulled tight.  If a leg strap gets wet, take it off and put on a dry strap.  If you use tape to hold the bag on your leg: 1. Use an alcohol wipe or soap and water to wash your skin where the tape made it sticky before. 2. Use a clean towel to pat-dry that skin. 3. Use new tape to make the bag stay on your leg. Wearing your bags You should have been given a large overnight bag.  You may wear the overnight bag in the day or night.  Always have the overnight bag lower than your bladder.  Do not let the bag touch the floor.  Before you go to sleep, put a clean plastic bag in a wastebasket. Then hang the overnight bag inside the wastebasket. You should also have a smaller leg bag that fits under your clothes.  Always wear the leg bag below your knee.  Do not wear your leg bag at night. How to care for your skin and catheter Supplies needed  A clean washcloth.  Water and mild soap.  A clean towel. Caring for your skin and catheter      Clean the skin around your catheter every day: ? Wash your hands with soap and water. ? Wet a clean washcloth in warm water and mild soap. ? Clean the skin around your urethra. ? If you are male: ? Gently  spread the folds of skin around your vagina (labia). ? With the washcloth in your other hand, wipe the inner side of your labia on each side. Wipe from front to back. ? If you are male: ? Pull back any skin that covers the end of your penis (foreskin). ? With the washcloth in your other hand, wipe your penis in small circles. Start wiping at the tip of your penis, then move away from the catheter. ? With your free hand, hold the catheter close to where it goes into your body. ? Keep holding the catheter during cleaning so it does not get pulled out. ? With the washcloth in your other hand, clean the catheter. ? Only wipe downward on the catheter. ? Do not wipe upward toward your body. Doing this may push germs into your urethra and cause infection. ? Use a clean towel to pat-dry the catheter and the skin around it. Make sure to wipe off all soap. ? Wash your hands with soap and water.  Shower every day. Do not take baths.  Do not use cream, ointment, or lotion on the area where the catheter goes into your body, unless your doctor tells you to.  Do not use powders, sprays, or lotions  on your genital area.  Check your skin around the catheter every day for signs of infection. Check for: ? Redness, swelling, or pain. ? Fluid or blood. ? Warmth. ? Pus or a bad smell. How to empty the bag Supplies needed  Rubbing alcohol.  Gauze pad or cotton ball.  Tape or a leg strap. Emptying the bag Pour the pee out of your bag when it is ?- full, or at least 2-3 times a day. Do this for your overnight bag and your leg bag. 1. Wash your hands with soap and water. 2. Separate (detach) the bag from your leg. 3. Hold the bag over the toilet or a clean pail. Keep the bag lower than your hips and bladder. This is so the pee (urine) does not go back into the tube. 4. Open the pour spout. It is at the bottom of the bag. 5. Empty the pee into the toilet or pail. Do not let the pour spout touch any  surface. 6. Put rubbing alcohol on a gauze pad or cotton ball. 7. Use the gauze pad or cotton ball to clean the pour spout. 8. Close the pour spout. 9. Attach the bag to your leg with tape or a leg strap. 10. Wash your hands with soap and water. Follow instructions for cleaning the drainage bag:  From the product maker.  As told by your doctor. How to change the bag Supplies needed  Alcohol wipes.  A clean bag.  Tape or a leg strap. Changing the bag Replace your bag with a clean bag once a month. If it starts to leak, smell bad, or look dirty, change it sooner. 1. Wash your hands with soap and water. 2. Separate the dirty bag from your leg. 3. Pinch the catheter with your fingers so that pee does not spill out. 4. Separate the catheter tube from the bag tube where these tubes connect (at the connection valve). Do not let the tubes touch any surface. 5. Clean the end of the catheter tube with an alcohol wipe. Use a different alcohol wipe to clean the end of the bag tube. 6. Connect the catheter tube to the tube of the clean bag. 7. Attach the clean bag to your leg with tape or a leg strap. Do not make the bag tight on your leg. 8. Wash your hands with soap and water. General rules   Never pull on your catheter. Never try to take it out. Doing that can hurt you.  Always wash your hands before and after you touch your catheter or bag. Use a mild, fragrance-free soap. If you do not have soap and water, use hand sanitizer.  Always make sure there are no twists or bends (kinks) in the catheter tube.  Always make sure there are no leaks in the catheter or bag.  Drink enough fluid to keep your pee pale yellow.  Do not take baths, swim, or use a hot tub.  If you are male, wipe from front to back after you poop (have a bowel movement). Contact a doctor if:  Your pee is cloudy.  Your pee smells worse than usual.  Your catheter gets clogged.  Your catheter leaks.  Your  bladder feels full. Get help right away if:  You have redness, swelling, or pain where the catheter goes into your body.  You have fluid, blood, pus, or a bad smell coming from the area where the catheter goes into your body.  Your skin feels warm  where the catheter goes into your body.  You have a fever.  You have pain in your: ? Belly (abdomen). ? Legs. ? Lower back. ? Bladder.  You see blood in the catheter.  Your pee is pink or red.  You feel sick to your stomach (nauseous).  You throw up (vomit).  You have chills.  Your pee is not draining into the bag.  Your catheter gets pulled out. Summary  An indwelling urinary catheter is a thin tube that is placed into the bladder to help drain pee (urine) out of the body.  The catheter is placed into the part of the body that drains pee from the bladder (urethra).  Taking good care of your catheter will keep it working properly and help prevent problems.  Always wash your hands before and after touching your catheter or bag.  Never pull on your catheter or try to take it out. This information is not intended to replace advice given to you by your health care provider. Make sure you discuss any questions you have with your health care provider. Document Released: 02/18/2013 Document Revised: 04/16/2018 Document Reviewed: 06/09/2017   Prostatic Urethral Lift, Care After This sheet gives you information about how to care for yourself after your procedure. Your health care provider may also give you more specific instructions. If you have problems or questions, contact your health care provider. What can I expect after the procedure? After the procedure, it is common to have:  Discomfort or burning when urinating.  An increased urge to urinate.  More frequent urination.  Urine that is slightly blood-tinged. These symptoms should go away after a few days. Follow these instructions at home:   Take over-the-counter  and prescription medicines only as told by your health care provider.  Do not drive for 24 hours if you were given a medicine to help you relax (sedative).  Do not drive or use heavy machinery while taking prescription pain medicine.  Do not lift anything that is heavier than 10 lb (4.5 kg) until your health care provider says that this is safe.  Return to your normal activities as told by your health care provider. Ask your health care provider what activities are safe for you. Ask when you can return to sexual activity.  Drink enough fluid to keep your urine clear or pale yellow.  Keep all follow-up visits as told by your health care provider. This is important. Contact a health care provider if:  You have chills or a fever.  You have pain when passing urine.  You have bright red blood or blood clots in your urine.  You have difficulty passing urine.  You have leaking of urine (incontinence). Get help right away if:  You have chest pain or shortness of breath.  You have leg pain or swelling.  You cannot pass urine. Summary  After the procedure, it is common to have discomfort or burning when urinating, an increased urge to urinate, more frequent urination, and urine that is slightly blood-tinged.  Do not drive for 24 hours if you were given a medicine to help you relax (sedative). Do not drive or use heavy machinery while taking prescription pain medicine.  Do not lift anything that is heavier than 10 lb (4.5 kg) until your health care provider says that this is safe.  Return to your normal activities as told by your health care provider. This information is not intended to replace advice given to you by your health care  provider. Make sure you discuss any questions you have with your health care provider. Document Released: 12/13/2016 Document Revised: 12/13/2016 Document Reviewed: 12/13/2016 [employer] Interactive Patient Education  2019 [employer]  Anesthesia Home Care Instructions  Activity: Get plenty of rest for the remainder of the day. A responsible individual must stay with you for 24 hours following the procedure.  For the next 24 hours, DO NOT: -Drive a car -Paediatric nurse -Drink alcoholic beverages -Take any medication unless instructed by your physician -Make any legal decisions or sign important papers.  Meals: Start with liquid foods such as gelatin or soup. Progress to regular foods as tolerated. Avoid greasy, spicy, heavy foods. If nausea and/or vomiting occur, drink only clear liquids until the nausea and/or vomiting subsides. Call your physician if vomiting continues.  Special Instructions/Symptoms: Your throat may feel dry or sore from the anesthesia or the breathing tube placed in your throat during surgery. If this causes discomfort, gargle with warm salt water. The discomfort should disappear within 24 hours.  If you had a scopolamine patch placed behind your ear for the management of post- operative nausea and/or vomiting:  1. The medication in the patch is effective for 72 hours, after which it should be removed.  Wrap patch in a tissue and discard in the trash. Wash hands thoroughly with soap and water. 2. You may remove the patch earlier than 72 hours if you experience unpleasant side effects which may include dry mouth, dizziness or visual disturbances. 3. Avoid touching the patch. Wash your hands with soap and water after contact with the patch.     May take Ibuprofen after 1:45 if needed

## 2018-12-07 NOTE — H&P (Signed)
H&P  Chief Complaint: bph with luts, bladder erythema   History of Present Illness:  [name] is a 63 year old white male with a history of BPH, hesitancy and weak stream.  He tried tamsulosin.  He continued to have symptoms and preferred to undergo UroLift in hopes to get off the medicine.  On cystoscopy in December he had right sidewall erythema and we will consider bladder biopsy.  Otherwise he has been well.  He had some mild congestion but no fever.  No dysuria.  Past Medical History:  Diagnosis Date  . BPH (benign prostatic hyperplasia)   . Coronary artery disease 1997   LAD  . Fatty liver 02/22/2007   Noted on [place] Abd  . GERD (gastroesophageal reflux disease)   . High cholesterol   . History of appendicitis    age 74  . History of hip fracture    Right  . Hypertension   . Wears glasses    Past Surgical History:  Procedure Laterality Date  . APPENDECTOMY    . BONE GRAFT HIP ILIAC CREST Right   . COLONOSCOPY    . CORONARY ANGIOPLASTY WITH STENT PLACEMENT     LAD  . HIP FRACTURE SURGERY Right   . TOTAL HIP ARTHROPLASTY Right   . TOTAL HIP REVISION Right   . UPPER GI ENDOSCOPY      Home Medications:  Medications Prior to Admission  Medication Sig Dispense Refill Last Dose  . ezetimibe-simvastatin (VYTORIN) 10-40 MG per tablet Take 1 tablet by mouth every evening.    12/06/2018 at Unknown time  . losartan (COZAAR) 50 MG tablet Take 50 mg by mouth daily. At lunch time   12/06/2018 at Unknown time  . omeprazole (PRILOSEC) 20 MG capsule Take 20 mg by mouth every evening.   12/06/2018 at Unknown time  . Psyllium (METAMUCIL PO) Take by mouth every evening.   12/06/2018 at Unknown time  . sildenafil (REVATIO) 20 MG tablet Take 20 mg by mouth once a week.   Past Month at Unknown time  . tamsulosin (FLOMAX) 0.4 MG CAPS capsule Take 0.4 mg by mouth every evening.   12/06/2018 at Unknown time  . traMADol (ULTRAM) 50 MG tablet Take by mouth every 6 (six) hours as needed.   Past  Week at Unknown time  . cephALEXin (KEFLEX) 500 MG capsule Take 1 capsule (500 mg total) by mouth 3 (three) times daily. (Patient taking differently: Take 500 mg by mouth 3 (three) times daily. Prior to dental appointments) 21 capsule 0 More than a month at Unknown time   Allergies:  Allergies  Allergen Reactions  . Shellfish Allergy Hives    History reviewed. No pertinent family history. Social History:  reports that he quit smoking about 20 years ago. He has never used smokeless tobacco. He reports current alcohol use. He reports that he does not use drugs.  ROS: A complete review of systems was performed.  All systems are negative except for pertinent findings as noted. Review of Systems  All other systems reviewed and are negative.    Physical Exam:  Vital signs in last 24 hours: Temp:  [98.2 F (36.8 C)] 98.2 F (36.8 C) (01/31 0533) Pulse Rate:  [75] 75 (01/31 0533) Resp:  [16] 16 (01/31 0533) BP: (123)/(89) 123/89 (01/31 0533) SpO2:  [97 %] 97 % (01/31 0533) Weight:  [98.5 kg] 98.5 kg (01/31 0533) General:  Alert and oriented, No acute distress HEENT: Normocephalic, atraumatic Lungs: Regular rate and effort Abdomen: Soft,  nontender, nondistended, no abdominal masses Back: No CVA tenderness Extremities: No edema Neurologic: Grossly intact  Laboratory Data:  Results for orders placed or performed during the hospital encounter of 12/07/18 (from the past 24 hour(s))  I-STAT 4, (NA,K, GLUC, HGB,HCT)     Status: None   Collection Time: 12/07/18  6:12 AM  Result Value Ref Range   Sodium 141 135 - 145 mmol/L   Potassium 4.3 3.5 - 5.1 mmol/L   Glucose, Bld 94 70 - 99 mg/dL   HCT 49.0 39.0 - 52.0 %   Hemoglobin 16.7 13.0 - 17.0 g/dL   No results found for this or any previous visit (from the past 240 hour(s)). Creatinine: No results for input(s): CREATININE in the last 168 hours.  Impression/Assessment:  BPH with lower urinary tract symptoms, bladder  erythema  Plan:  I discussed with the patient the nature, potential benefits, risks and alternatives to cystoscopy, bladder biopsy, prostatic urethral lift, including side effects of the proposed treatment, the likelihood of the patient achieving the goals of the procedure, and any potential problems that might occur during the procedure or recuperation. All questions answered. Patient elects to proceed.    Festus Aloe 12/07/2018, 7:27 AM

## 2018-12-07 NOTE — Op Note (Signed)
Preoperative diagnosis: BPH with lower urinary tract symptoms, bladder erythema Postoperative diagnosis: BPH with lower urinary tract symptoms, weak stream  Procedure: Cystoscopy, prostatic urethral lift x4  Surgeon: Junious Silk  Anesthesia: General  Indication for procedure: 63 year old male with urinary hesitancy and weak stream with BPH and visual obstruction brought for UroLift.  He had erythema of the bladder wall on office cystoscopy.  Findings:   On cystoscopy the prostate was obstructed by lateral lobe hypertrophy.  There was a bit of a high bladder neck.  There was no bladder erythema or conspicuous areas.  There were no papillary tumors.  There were no stones or foreign bodies in the bladder.  The bladder contained small petechiae but these were not bleeding.  On exam under anesthesia the penis was circumcised and without mass or lesion.  The testicles was in a bilaterally and palpably normal.  On digital rectal exam the prostate was smooth without hard area or nodule in about 40 g.  I description of procedure: After consent was obtained the patient brought to the operating room.  After adequate anesthesia was placed in lithotomy position and prepped and draped in the usual sterile fashion.  A timeout was performed to confirm the patient and procedure.  The cystoscope was passed per urethra and I carefully inspected the bladder with a 30 degree and 70 degree lens.  No areas of bladder erythema or papillary lesions were noted.  The 15F cystoscope was inserted into the bladder. The cystoscopy bridge was replaced with a UroLift delivery device.Becasue of the short prostatic urethra and high bladder neck, the first treatment site was the patient's left side at just proximal to the verumontanum (1). The distal tip of the delivery device was then angled laterally approximately 20 degrees at this position to compress the lateral lobe. The trigger was pulled, thereby deploying a needle containing  the implant through the prostate. The needle was then retracted, allowing one end of the implant to be delivered to the capsular surface of the prostate. The implant was then tensioned to assure capsular seating and removal of slack monofilament. The device was then angled back toward midline and slowly advanced proximally until cystoscopic verification of the monofilament being centered in the delivery bay. The urethral end piece was then affixed to the monofilament thereby tailoring the size of the implant. Excess filament was then severed. The delivery device was then re-advanced into the bladder. The delivery device was then replaced with cystoscope and bridge and the implant location and opening effect was confirmed cystoscopically. The second implant was delivered just proximal to the veru on the right side following the same technique.  I then placed 2 additional implants just proximally and superiorly on the left (3) and then right (4) to stack the implants to prevent a cat eye effect. These implants were distal to the bladder neck.  A final cystoscopy was conducted first to inspect the location and state of each implant and second, to confirm the presence of a continuous anterior channel was present through the prostatic urethra with irrigation flow turned off. 4 Implants were delivered in total.   Following this, the scope was removed and an 16 [place] Foley catheter was placed and hooked to dependent drainage. He was then awakened and taken to the recovery area in stable condition. He tolerated the procedure well.  Complications: None  Blood loss: Minimal  Specimens: None  Drains: 16 French Foley catheter  Disposition: Patient stable to PACU

## 2018-12-07 NOTE — Anesthesia Procedure Notes (Signed)
Procedure Name: LMA Insertion Date/Time: 12/07/2018 7:38 AM Performed by: Bonney Aid, CRNA Pre-anesthesia Checklist: Patient identified, Emergency Drugs available, Suction available and Patient being monitored Patient Re-evaluated:Patient Re-evaluated prior to induction Oxygen Delivery Method: Circle system utilized Preoxygenation: Pre-oxygenation with 100% oxygen Induction Type: IV induction Ventilation: Mask ventilation without difficulty LMA: LMA inserted LMA Size: 4.0 Number of attempts: 1 Airway Equipment and Method: Bite block Placement Confirmation: positive ETCO2 Tube secured with: Tape Dental Injury: Teeth and Oropharynx as per pre-operative assessment

## 2018-12-10 ENCOUNTER — Encounter (HOSPITAL_BASED_OUTPATIENT_CLINIC_OR_DEPARTMENT_OTHER): Payer: Self-pay | Admitting: Urology

## 2019-04-08 DIAGNOSIS — N401 Enlarged prostate with lower urinary tract symptoms: Secondary | ICD-10-CM | POA: Diagnosis not present

## 2019-04-08 DIAGNOSIS — N5201 Erectile dysfunction due to arterial insufficiency: Secondary | ICD-10-CM | POA: Diagnosis not present

## 2019-04-08 DIAGNOSIS — R3912 Poor urinary stream: Secondary | ICD-10-CM | POA: Diagnosis not present

## 2019-05-23 ENCOUNTER — Encounter: Payer: Self-pay | Admitting: Cardiology

## 2019-05-27 ENCOUNTER — Encounter: Payer: Self-pay | Admitting: Cardiology

## 2019-05-27 ENCOUNTER — Ambulatory Visit: Payer: BC Managed Care – PPO | Admitting: Cardiology

## 2019-05-27 ENCOUNTER — Other Ambulatory Visit: Payer: Self-pay

## 2019-05-27 VITALS — BP 123/76 | HR 71 | Temp 98.6°F | Ht 73.0 in | Wt 200.0 lb

## 2019-05-27 DIAGNOSIS — Z8249 Family history of ischemic heart disease and other diseases of the circulatory system: Secondary | ICD-10-CM

## 2019-05-27 DIAGNOSIS — I10 Essential (primary) hypertension: Secondary | ICD-10-CM

## 2019-05-27 DIAGNOSIS — E7800 Pure hypercholesterolemia, unspecified: Secondary | ICD-10-CM

## 2019-05-27 DIAGNOSIS — I2510 Atherosclerotic heart disease of native coronary artery without angina pectoris: Secondary | ICD-10-CM

## 2019-05-27 NOTE — Progress Notes (Signed)
Virtual Visit via Video Note: This visit type was conducted due to national recommendations for restrictions regarding the COVID-19 Pandemic (e.g. social distancing).  This format is felt to be most appropriate for this patient at this time.  All issues noted in this document were discussed and addressed.  No physical exam was performed (except for noted visual exam findings with Telehealth visits).  The patient has consented to conduct a Telehealth visit and understands insurance will be billed.   I connected with@, on 05/27/19 at  by a video enabled telemedicine application and verified that I am speaking with the correct person using two identifiers.   I discussed the limitations of evaluation and management by telemedicine and the availability of in person appointments. The patient expressed understanding and agreed to proceed.   I have discussed with patient regarding the safety during COVID Pandemic and steps and precautions to be taken including social distancing, frequent hand wash and use of detergent soap, gels with the patient. I asked the patient to avoid touching mouth, nose, eyes, ears with the hands. I encouraged regular walking around the neighborhood and exercise and regular diet, as long as social distancing can be maintained.  Primary Physician/Referring:  Jani Gravel, MD  Patient ID: Roselind Messier, male    DOB: 06-16-1956, 63 y.o.   MRN: [id number]  Chief Complaint  Patient presents with  . Coronary Artery Disease  . Follow-up    10yr   HPI:    HPI: ALECSANDER HATTABAUGH  is a 63 y.o. male   history of HTN, HLD, hyperglycemia, f/h of premature CAD now presents for annual visit and f/u of  coronary artery disease with H/O myocardial infarction in 1996 and has LAD stent. He has had a negative nuclear stress test in May 2013. 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 Has lost 25 Lbs intentionally. He is scheduled for labs and complete  physical next week with Vassie Moment, PA-C  Past Medical History:  Diagnosis Date  . BPH (benign prostatic hyperplasia)   . CAD (coronary artery disease), native coronary artery 11/08/1995   LAD stent  . Coronary artery disease 1997   LAD  . Fatty liver 02/22/2007   Noted on [place] Abd  . GERD (gastroesophageal reflux disease)   . High cholesterol   . History of appendicitis    age 52  . History of hip fracture    Right  . Hypertension   . Wears glasses    Past Surgical History:  Procedure Laterality Date  . APPENDECTOMY    . BONE GRAFT HIP ILIAC CREST Right   . COLONOSCOPY    . CORONARY ANGIOPLASTY WITH STENT PLACEMENT     LAD  . CYSTOSCOPY WITH INSERTION OF UROLIFT N/A 12/07/2018   Procedure: CYSTOSCOPY WITH INSERTION OF UROLIFT;  Surgeon: Festus Aloe, MD;  Location: [facility];  Service: Urology;  Laterality: N/A;  . HIP FRACTURE SURGERY Right   . TOTAL HIP ARTHROPLASTY Right   . TOTAL HIP REVISION Right   . UPPER GI ENDOSCOPY     Social History   Socioeconomic History  . Marital status: Married    Spouse name: Not on file  . Number of children: 3  . Years of education: Not on file  . Highest education level: Not on file  Occupational History  . Not on file  Social Needs  . Financial resource strain: Not on file  .  Food insecurity    Worry: Not on file    Inability: Not on file  . Transportation needs    Medical: Not on file    Non-medical: Not on file  Tobacco Use  . Smoking status: Former Smoker    Quit date: 2000    Years since quitting: 20.5  . Smokeless tobacco: Never Used  Substance and Sexual Activity  . Alcohol use: Yes    Comment: occ  . Drug use: No  . Sexual activity: Not on file  Lifestyle  . Physical activity    Days per week: Not on file    Minutes per session: Not on file  . Stress: Not on file  Relationships  . Social Herbalist on phone: Not on file    Gets together: Not on file    Attends religious  service: Not on file    Active member of club or organization: Not on file    Attends meetings of clubs or organizations: Not on file    Relationship status: Not on file  . Intimate partner violence    Fear of current or ex partner: Not on file    Emotionally abused: Not on file    Physically abused: Not on file    Forced sexual activity: Not on file  Other Topics Concern  . Not on file  Social History Narrative  . Not on file   ROS  Review of Systems  Constitution: Negative for chills, decreased appetite, malaise/fatigue and weight gain.  Cardiovascular: Negative for dyspnea on exertion, leg swelling and syncope.  Endocrine: Negative for cold intolerance.  Hematologic/Lymphatic: Does not bruise/bleed easily.  Musculoskeletal: Negative for joint swelling.  Gastrointestinal: Negative for abdominal pain, anorexia, change in bowel habit, hematochezia and melena.  Neurological: Negative for headaches and light-headedness.  Psychiatric/Behavioral: Negative for depression and substance abuse.  All other systems reviewed and are negative.  Objective  Blood pressure 123/76, pulse 71, temperature 98.6 F (37 C), height 6\' 1"  (1.854 m), weight 200 lb (90.7 kg). Body mass index is 26.39 kg/m.  Physical exam not performed or limited due to virtual visit.  Patient appeared to be in no distress, Neck was supple, respiration was not labored.  Please see exam details from prior visit is as below.  Physical Exam  Constitutional: He appears well-developed and well-nourished. No distress.  HENT:  Head: Atraumatic.  Eyes: Conjunctivae are normal.  Neck: Neck supple. No JVD present. No thyromegaly present.  Cardiovascular: Normal rate, regular rhythm, normal heart sounds and intact distal pulses. Exam reveals no gallop.  No murmur heard. Pulmonary/Chest: Effort normal and breath sounds normal.  Barrel-shaped chest  Abdominal: Soft. Bowel sounds are normal.  Musculoskeletal: Normal range of  motion.  Neurological: He is alert.  Skin: Skin is warm and dry.  Psychiatric: He has a normal mood and affect.   Radiology: No results found.  Laboratory examination:   CMP Latest Ref Rng & Units 12/07/2018 07/26/2007 07/25/2007  Glucose 70 - 99 mg/dL 94 112(H) 147(H)  BUN - - 16 23  Creatinine - - 0.77 DELTA CHECK NOTED 1.16  Sodium 135 - 145 mmol/L 141 134(L) 133(L)  Potassium 3.5 - 5.1 mmol/L 4.3 4.1 4.9  Chloride - - 101 99  CO2 - - 28 26  Calcium - - 8.1(L) 8.6  Total Protein - - - -  Total Bilirubin - - - -  Alkaline Phos - - - -  AST - - - -  ALT - - - -   CBC Latest Ref Rng & Units 12/07/2018 07/27/2007 07/26/2007  WBC - - 7.9 9.4  Hemoglobin 13.0 - 17.0 g/dL 16.7 7.4 REPEATED TO VERIFY CRITICAL VALUE NOTED.  VALUE IS CONSISTENT WITH PREVIOUSLY REPORTED AND CALLED VALUE.(LL) 7.8 REPEATED TO VERIFY CRITICAL RESULT CALLED TO, READ BACK BY AND VERIFIED WITH: [id number](LL)  Hematocrit 39.0 - 52.0 % 49.0 20.9(L) 22.1(L)  Platelets - - 187 183   Lipid Panel  No results found for: CHOL, TRIG, HDL, CHOLHDL, VLDL, LDLCALC, LDLDIRECT HEMOGLOBIN A1C No results found for: HGBA1C, MPG TSH No results for input(s): TSH in the last 8760 hours. Medications   Current Outpatient Medications  Medication Instructions  . aspirin 81 mg, Oral, Daily  . ezetimibe-simvastatin (VYTORIN) 10-40 MG per tablet 1 tablet, Oral, Every evening  . losartan (COZAAR) 100 mg, Oral, Daily, At lunch time  . omeprazole (PRILOSEC) 25 mg, Oral, Every evening  . Psyllium (METAMUCIL PO) Oral, Every evening  . sildenafil (REVATIO) 20 mg, Oral, Weekly  . traMADol (ULTRAM) 50 MG tablet Oral, Every 6 hours PRN   Cardiac Studies:   Echo 04/11/12: Normal echocardiogram. Trace TR, MR.  Exercise sestamibi 04/06/12: Diaphragmatic attenuation artifact. Low risk. Ex 6:21, 10 METs. Fatigue  Assessment     ICD-10-CM   1. Hypercholesteremia  E78.00   2. Coronary artery disease involving native  coronary artery of native heart without angina pectoris  I25.10   3. Essential hypertension  I10   4. Family history of premature CAD  Z76.49    Mother died at 66Y-stroke. Father died 23Y- MI. 3 siblings-Oldest brother  CAD  45Y    05/24/2018: Normal sinus rhythm at 83 bpm, left atrial enlargement, normal axis, no evidence of ischemia.  No changes compared to EKG in 2018.  Recommendations:   This is annual visit and follow-up of coronary artery disease, hyperlipidemia and family history of premature coronary artery disease arises factors, remote coronary artery stenting to the LAD.  He has continued to exercise regularly and has not noticed any dyspnea or symptoms of angina.  I have again given him positive reinforcement, he has lost 25 pounds in weight since he retired and has been working with his wife.  He is on appropriate medical therapy.  I do not have his recent labs but has been scheduled for a complete physical next week along with labs.  He will updated them.  Advised him that his call us to be seen her for develops any dyspnea or chest pain or palpitations.  I'll see him back in a year.  Adrian Prows, MD, [facility] 05/27/2019, 9:43 AM [facility] [place] Pager: [phone] Office: [phone] If no answer Cell [phone]

## 2019-05-28 DIAGNOSIS — Z0000 Encounter for general adult medical examination without abnormal findings: Secondary | ICD-10-CM | POA: Diagnosis not present

## 2019-05-28 DIAGNOSIS — Z125 Encounter for screening for malignant neoplasm of prostate: Secondary | ICD-10-CM | POA: Diagnosis not present

## 2019-05-28 DIAGNOSIS — E785 Hyperlipidemia, unspecified: Secondary | ICD-10-CM | POA: Diagnosis not present

## 2019-05-28 DIAGNOSIS — I10 Essential (primary) hypertension: Secondary | ICD-10-CM | POA: Diagnosis not present

## 2019-06-04 DIAGNOSIS — R739 Hyperglycemia, unspecified: Secondary | ICD-10-CM | POA: Diagnosis not present

## 2019-06-04 DIAGNOSIS — Z0000 Encounter for general adult medical examination without abnormal findings: Secondary | ICD-10-CM | POA: Diagnosis not present

## 2019-10-08 DIAGNOSIS — N5201 Erectile dysfunction due to arterial insufficiency: Secondary | ICD-10-CM | POA: Diagnosis not present

## 2019-10-08 DIAGNOSIS — N401 Enlarged prostate with lower urinary tract symptoms: Secondary | ICD-10-CM | POA: Diagnosis not present

## 2019-10-08 DIAGNOSIS — R3915 Urgency of urination: Secondary | ICD-10-CM | POA: Diagnosis not present

## 2019-11-28 DIAGNOSIS — E7800 Pure hypercholesterolemia, unspecified: Secondary | ICD-10-CM | POA: Diagnosis not present

## 2019-11-28 DIAGNOSIS — R739 Hyperglycemia, unspecified: Secondary | ICD-10-CM | POA: Diagnosis not present

## 2019-11-28 DIAGNOSIS — Z79891 Long term (current) use of opiate analgesic: Secondary | ICD-10-CM | POA: Diagnosis not present

## 2019-11-28 DIAGNOSIS — I10 Essential (primary) hypertension: Secondary | ICD-10-CM | POA: Diagnosis not present

## 2019-12-05 DIAGNOSIS — E785 Hyperlipidemia, unspecified: Secondary | ICD-10-CM | POA: Diagnosis not present

## 2019-12-05 DIAGNOSIS — I10 Essential (primary) hypertension: Secondary | ICD-10-CM | POA: Diagnosis not present

## 2019-12-05 DIAGNOSIS — R7303 Prediabetes: Secondary | ICD-10-CM | POA: Diagnosis not present

## 2019-12-05 DIAGNOSIS — M1990 Unspecified osteoarthritis, unspecified site: Secondary | ICD-10-CM | POA: Diagnosis not present

## 2020-03-13 DIAGNOSIS — N5201 Erectile dysfunction due to arterial insufficiency: Secondary | ICD-10-CM | POA: Diagnosis not present

## 2020-03-13 DIAGNOSIS — R3912 Poor urinary stream: Secondary | ICD-10-CM | POA: Diagnosis not present

## 2020-03-13 DIAGNOSIS — N401 Enlarged prostate with lower urinary tract symptoms: Secondary | ICD-10-CM | POA: Diagnosis not present

## 2020-03-13 DIAGNOSIS — R3121 Asymptomatic microscopic hematuria: Secondary | ICD-10-CM | POA: Diagnosis not present

## 2020-05-25 ENCOUNTER — Encounter: Payer: Self-pay | Admitting: Cardiology

## 2020-05-25 ENCOUNTER — Ambulatory Visit: Payer: BC Managed Care – PPO | Admitting: Cardiology

## 2020-05-25 ENCOUNTER — Other Ambulatory Visit: Payer: Self-pay

## 2020-05-25 VITALS — BP 124/72 | HR 65 | Resp 16 | Ht 73.0 in | Wt 204.0 lb

## 2020-05-25 DIAGNOSIS — I2510 Atherosclerotic heart disease of native coronary artery without angina pectoris: Secondary | ICD-10-CM | POA: Diagnosis not present

## 2020-05-25 DIAGNOSIS — E7800 Pure hypercholesterolemia, unspecified: Secondary | ICD-10-CM

## 2020-05-25 DIAGNOSIS — I10 Essential (primary) hypertension: Secondary | ICD-10-CM | POA: Diagnosis not present

## 2020-05-25 NOTE — Progress Notes (Signed)
Primary Physician/Referring:  Jani Gravel, MD  Patient ID: James Koch, male    DOB: [birth year], 64 y.o.   MRN: [id number]  Chief Complaint  Patient presents with  . Coronary Artery Disease  . Follow-up    1 year   HPI:    HPI: James Koch  is a 64 y.o. male   history of HTN, HLD, hyperglycemia, f/h of premature CAD now presents for annual visit and f/u of  coronary artery disease with H/O myocardial infarction in 1996 and has LAD stent. He has had a negative nuclear stress test in May 2013. 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 Has lost 25 Lbs intentionally. He is scheduled for labs and complete physical next  Month.   Past Medical History:  Diagnosis Date  . BPH (benign prostatic hyperplasia)   . CAD (coronary artery disease), native coronary artery 11/08/1995   LAD stent  . Coronary artery disease 1997   LAD  . Fatty liver 02/22/2007   Noted on [place] Abd  . GERD (gastroesophageal reflux disease)   . High cholesterol   . History of appendicitis    age 53  . History of hip fracture    Right  . Hypertension   . Wears glasses    Past Surgical History:  Procedure Laterality Date  . APPENDECTOMY    . BONE GRAFT HIP ILIAC CREST Right   . COLONOSCOPY    . CORONARY ANGIOPLASTY WITH STENT PLACEMENT     LAD  . CYSTOSCOPY WITH INSERTION OF UROLIFT N/A 12/07/2018   Procedure: CYSTOSCOPY WITH INSERTION OF UROLIFT;  Surgeon: Festus Aloe, MD;  [facility];  Service: Urology;  Laterality: N/A;  . HIP FRACTURE SURGERY Right   . TOTAL HIP ARTHROPLASTY Right   . TOTAL HIP REVISION Right   . UPPER GI ENDOSCOPY     Social History   Tobacco Use  . Smoking status: Former Smoker    Types: Cigarettes    Quit date: 2000    Years since quitting: 21.5  . Smokeless tobacco: Never Used  Substance Use Topics  . Alcohol use: Yes    Comment: occ   Marital Status: Married   ROS  Review of Systems    Cardiovascular: Negative for chest pain, dyspnea on exertion and leg swelling.  Gastrointestinal: Negative for melena.   Objective  Blood pressure 124/72, pulse 65, resp. rate 16, height 6\' 1"  (1.854 m), weight 204 lb (92.5 kg), SpO2 96 %. Body mass index is 26.91 kg/m.   Vitals with BMI 05/25/2020 05/27/2019 12/07/2018  Height 6\' 1"  6\' 1"  -  Weight 204 lbs 200 lbs -  BMI 72.09 47.09 -  Systolic 628 366 294  Diastolic 72 76 94  Pulse 65 71 80    Physical Exam Cardiovascular:     Rate and Rhythm: Normal rate and regular rhythm.     Pulses: Intact distal pulses.     Heart sounds: Normal heart sounds. No murmur heard.  No gallop.      Comments: No leg edema, no JVD. Pulmonary:     Effort: Pulmonary effort is normal.     Breath sounds: Normal breath sounds.  Abdominal:     General: Bowel sounds are normal.     Palpations: Abdomen is soft.    Radiology: No results found.  Laboratory examination:   External Labs:     Medications   Current Outpatient  Medications  Medication Instructions  . aspirin 81 mg, Oral, Daily  . ezetimibe-simvastatin (VYTORIN) 10-40 MG per tablet 1 tablet, Oral, Every evening  . losartan (COZAAR) 100 mg, Oral, Daily, At lunch time  . omeprazole (PRILOSEC) 20 mg, Oral, Every evening  . Psyllium (METAMUCIL PO) Oral, Every evening  . sildenafil (REVATIO) 20 mg, Oral, Weekly  . traMADol (ULTRAM) 50 MG tablet Oral, Every 6 hours PRN   Cardiac Studies:   Echo 04/11/12: Normal echocardiogram. Trace TR, MR.  Exercise sestamibi 04/06/12: Diaphragmatic attenuation artifact. Low risk. Ex 6:21, 10 METs. Fatigue.  EKG:  EKG 05/25/2020: Normal sinus rhythm with rate of 75 bpm, left atrial enlargement, incomplete right bundle branch block.  No evidence of ischemia.  Assessment     ICD-10-CM   1. Coronary artery disease involving native coronary artery of native heart without angina pectoris  I25.10 EKG 12-Lead  2. Hypercholesteremia  E78.00   3.  Essential hypertension  I10     05/24/2018: Normal sinus rhythm at 83 bpm, left atrial enlargement, normal axis, no evidence of ischemia.  No changes compared to EKG in 2018.  Recommendations:   James Koch  is a 64 y.o. male   history of HTN, HLD, hyperglycemia, f/h of premature CAD now presents for annual visit and f/u of  coronary artery disease with H/O myocardial infarction in 1996 and has LAD stent.   He has continued to exercise regularly and has not noticed any dyspnea or symptoms of angina. He is on appropriate medical therapy.  I do not have his recent labs but has been scheduled for a complete physical next month along with labs.  Lipids are being managed by his PCP.  He will updated them via [employer].    Advised him that his call us to be seen her for develops any dyspnea or chest pain or palpitations.  I'll see him back in a year.   Adrian Prows, MD, [facility] 05/25/2020, 11:03 AM Office: [phone]

## 2020-06-03 DIAGNOSIS — Z125 Encounter for screening for malignant neoplasm of prostate: Secondary | ICD-10-CM | POA: Diagnosis not present

## 2020-06-03 DIAGNOSIS — R7303 Prediabetes: Secondary | ICD-10-CM | POA: Diagnosis not present

## 2020-06-03 DIAGNOSIS — I10 Essential (primary) hypertension: Secondary | ICD-10-CM | POA: Diagnosis not present

## 2020-06-12 DIAGNOSIS — E291 Testicular hypofunction: Secondary | ICD-10-CM | POA: Diagnosis not present

## 2020-06-12 DIAGNOSIS — R739 Hyperglycemia, unspecified: Secondary | ICD-10-CM | POA: Diagnosis not present

## 2020-06-12 DIAGNOSIS — Z0000 Encounter for general adult medical examination without abnormal findings: Secondary | ICD-10-CM | POA: Diagnosis not present

## 2020-06-12 DIAGNOSIS — I10 Essential (primary) hypertension: Secondary | ICD-10-CM | POA: Diagnosis not present

## 2020-06-12 NOTE — Progress Notes (Signed)
Lab 06/03/2020:  Hb 16.0/HCT 47.5, platelets 179.  Normal indicis.  Sodium 142, serum creatinine 1.1, BUN 12, EGFR >60 mL.  CMP otherwise normal.  A1c 6.1%.  Total cholesterol 125, triglycerides 88, HDL 42, LDL 65.  Non-HDL cholesterol 83.

## 2020-07-10 DIAGNOSIS — U071 COVID-19: Secondary | ICD-10-CM | POA: Diagnosis not present

## 2020-12-10 DIAGNOSIS — I10 Essential (primary) hypertension: Secondary | ICD-10-CM | POA: Diagnosis not present

## 2020-12-10 DIAGNOSIS — E7800 Pure hypercholesterolemia, unspecified: Secondary | ICD-10-CM | POA: Diagnosis not present

## 2020-12-10 DIAGNOSIS — E559 Vitamin D deficiency, unspecified: Secondary | ICD-10-CM | POA: Diagnosis not present

## 2020-12-10 DIAGNOSIS — R7303 Prediabetes: Secondary | ICD-10-CM | POA: Diagnosis not present

## 2020-12-10 DIAGNOSIS — E291 Testicular hypofunction: Secondary | ICD-10-CM | POA: Diagnosis not present

## 2020-12-14 DIAGNOSIS — M810 Age-related osteoporosis without current pathological fracture: Secondary | ICD-10-CM | POA: Diagnosis not present

## 2020-12-14 DIAGNOSIS — R7303 Prediabetes: Secondary | ICD-10-CM | POA: Diagnosis not present

## 2020-12-14 DIAGNOSIS — I2510 Atherosclerotic heart disease of native coronary artery without angina pectoris: Secondary | ICD-10-CM | POA: Diagnosis not present

## 2020-12-14 DIAGNOSIS — Z955 Presence of coronary angioplasty implant and graft: Secondary | ICD-10-CM | POA: Diagnosis not present

## 2020-12-14 DIAGNOSIS — Z23 Encounter for immunization: Secondary | ICD-10-CM | POA: Diagnosis not present

## 2021-05-26 ENCOUNTER — Other Ambulatory Visit: Payer: Self-pay

## 2021-05-26 ENCOUNTER — Ambulatory Visit: Payer: BC Managed Care – PPO | Admitting: Cardiology

## 2021-05-26 ENCOUNTER — Encounter: Payer: Self-pay | Admitting: Cardiology

## 2021-05-26 VITALS — BP 134/78 | HR 76 | Temp 97.8°F | Resp 16 | Ht 73.0 in | Wt 212.6 lb

## 2021-05-26 DIAGNOSIS — E7800 Pure hypercholesterolemia, unspecified: Secondary | ICD-10-CM | POA: Diagnosis not present

## 2021-05-26 DIAGNOSIS — I10 Essential (primary) hypertension: Secondary | ICD-10-CM | POA: Diagnosis not present

## 2021-05-26 DIAGNOSIS — I2510 Atherosclerotic heart disease of native coronary artery without angina pectoris: Secondary | ICD-10-CM | POA: Diagnosis not present

## 2021-05-26 NOTE — Progress Notes (Signed)
Primary Physician/Referring:  Janie Morning, DO  Patient ID: James Koch, male    DOB: 1956/08/12, 65 y.o.   MRN: [id number]  Chief Complaint  Patient presents with   Coronary Artery Disease   Hypertension   Follow-up    1 year   HPI:    HPI: James Koch  is a 65 y.o. male   history of HTN, HLD, hyperglycemia, f/h of premature CAD now presents for annual visit and f/u of  coronary artery disease with H/O myocardial infarction in 1996 and has LAD stent.  He has had a negative nuclear stress test in May 2013. 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    Past Medical History:  Diagnosis Date   BPH (benign prostatic hyperplasia)    CAD (coronary artery disease), native coronary artery 11/08/1995   LAD stent   Coronary artery disease 1997   LAD   Fatty liver 02/22/2007   Noted on [place] Abd   GERD (gastroesophageal reflux disease)    High cholesterol    History of appendicitis    age 88   History of hip fracture    Right   Hypertension    Wears glasses    Past Surgical History:  Procedure Laterality Date   APPENDECTOMY     BONE GRAFT HIP ILIAC CREST Right    COLONOSCOPY     CORONARY ANGIOPLASTY WITH STENT PLACEMENT     LAD   CYSTOSCOPY WITH INSERTION OF UROLIFT N/A 12/07/2018   Procedure: CYSTOSCOPY WITH INSERTION OF UROLIFT;  Surgeon: Festus Aloe, MD;  Location: [place];  Service: Urology;  Laterality: N/A;   HIP FRACTURE SURGERY Right    TOTAL HIP ARTHROPLASTY Right    TOTAL HIP REVISION Right    UPPER GI ENDOSCOPY     Social History   Tobacco Use   Smoking status: Former    Types: Cigarettes    Quit date: 2000    Years since quitting: 22.5   Smokeless tobacco: Never  Substance Use Topics   Alcohol use: Yes    Comment: occ   Marital Status: Married   ROS  Review of Systems  Cardiovascular:  Negative for chest pain, dyspnea on exertion and leg swelling.  Gastrointestinal:   Negative for melena.  Objective  Blood pressure 134/78, pulse 76, temperature 97.8 F (36.6 C), temperature source Temporal, resp. rate 16, height '6\' 1"'  (1.854 m), weight 212 lb 9.6 oz (96.4 kg), SpO2 97 %. Body mass index is 28.05 kg/m.   Vitals with BMI 05/26/2021 05/25/2020 05/27/2019  Height '6\' 1"'  '6\' 1"'  '6\' 1"'   Weight 212 lbs 10 oz 204 lbs 200 lbs  BMI 28.06 50.03 70.48  Systolic 889 169 450  Diastolic 78 72 76  Pulse 76 65 71    Physical Exam Cardiovascular:     Rate and Rhythm: Normal rate and regular rhythm.     Pulses: Intact distal pulses.     Heart sounds: Normal heart sounds. No murmur heard.   No gallop.     Comments: No leg edema, no JVD. Pulmonary:     Effort: Pulmonary effort is normal.     Breath sounds: Normal breath sounds.  Chest:     Comments: Pectus carinatum noted Abdominal:     General: Bowel sounds are normal.     Palpations: Abdomen is soft.   Radiology: No results found.  Laboratory examination:   External  Labs:   Lab 06/03/2020:  Hb 16.0/HCT 47.5, platelets 179.  Normal indicis.  Sodium 142, serum creatinine 1.1, BUN 12, EGFR >60 mL.  CMP otherwise normal.  A1c 6.1%.  Total cholesterol 125, triglycerides 88, HDL 42, LDL 65.  Non-HDL cholesterol 83.  Medications   Current Outpatient Medications  Medication Instructions   aspirin 81 mg, Oral, Daily   ezetimibe-simvastatin (VYTORIN) 10-40 MG per tablet 1 tablet, Oral, Every evening   losartan (COZAAR) 100 mg, Oral, Daily, At lunch time   omeprazole (PRILOSEC) 20 mg, Oral, Every evening   Psyllium (METAMUCIL PO) Oral, Every evening   sildenafil (REVATIO) 20 mg, Oral, Weekly   traMADol (ULTRAM) 50 MG tablet Oral, Every 6 hours PRN   Cardiac Studies:   Echo 04/11/12: Normal echocardiogram. Trace TR, MR.  Exercise sestamibi 04/06/12: Diaphragmatic attenuation artifact. Low risk. Ex 6:21, 10 METs. Fatigue.  EKG:  EKG 05/26/2021: Normal sinus rhythm at rate of 67 bpm, normal axis,  incomplete right bundle branch block.  No evidence of ischemia.  No change from 05/25/2020.  Assessment     ICD-10-CM   1. Coronary artery disease involving native coronary artery of native heart without angina pectoris  I25.10 EKG 12-Lead    2. Hypercholesteremia  E78.00     3. Essential hypertension  I10       Recommendations:   James Koch  is a 65 y.o. male   history of HTN, HLD, hyperglycemia, f/h of premature CAD now presents for annual visit and f/u of  coronary artery disease with H/O myocardial infarction in 1996 and has LAD stent.   He has continued to exercise regularly and has not noticed any dyspnea or symptoms of angina. He is on appropriate medical therapy.  Blood pressures well controlled, lipids are also under excellent control as well.  No changes in physical exam, EKG is normal.  I discussed regarding making lifestyle changes especially reducing his red meat intake which is at least 3 to 4 days a week, to change it once a week and to increase fish consumption and also increase vegetables and poultry.  I'll see him back in a year.   Adrian Prows, MD, [facility] 05/26/2021, 10:38 AM Office: [phone]

## 2021-06-07 DIAGNOSIS — E559 Vitamin D deficiency, unspecified: Secondary | ICD-10-CM | POA: Diagnosis not present

## 2021-06-07 DIAGNOSIS — Z125 Encounter for screening for malignant neoplasm of prostate: Secondary | ICD-10-CM | POA: Diagnosis not present

## 2021-06-07 DIAGNOSIS — Z955 Presence of coronary angioplasty implant and graft: Secondary | ICD-10-CM | POA: Diagnosis not present

## 2021-06-07 DIAGNOSIS — I10 Essential (primary) hypertension: Secondary | ICD-10-CM | POA: Diagnosis not present

## 2021-06-07 DIAGNOSIS — R7303 Prediabetes: Secondary | ICD-10-CM | POA: Diagnosis not present

## 2021-06-07 DIAGNOSIS — R946 Abnormal results of thyroid function studies: Secondary | ICD-10-CM | POA: Diagnosis not present

## 2021-06-16 DIAGNOSIS — I10 Essential (primary) hypertension: Secondary | ICD-10-CM | POA: Diagnosis not present

## 2021-06-16 DIAGNOSIS — R7303 Prediabetes: Secondary | ICD-10-CM | POA: Diagnosis not present

## 2021-06-16 DIAGNOSIS — S61209A Unspecified open wound of unspecified finger without damage to nail, initial encounter: Secondary | ICD-10-CM | POA: Diagnosis not present

## 2021-06-16 DIAGNOSIS — Z955 Presence of coronary angioplasty implant and graft: Secondary | ICD-10-CM | POA: Diagnosis not present

## 2021-06-16 DIAGNOSIS — Z23 Encounter for immunization: Secondary | ICD-10-CM | POA: Diagnosis not present

## 2021-06-16 DIAGNOSIS — M1990 Unspecified osteoarthritis, unspecified site: Secondary | ICD-10-CM | POA: Diagnosis not present

## 2021-06-16 DIAGNOSIS — E785 Hyperlipidemia, unspecified: Secondary | ICD-10-CM | POA: Diagnosis not present

## 2021-06-16 DIAGNOSIS — M810 Age-related osteoporosis without current pathological fracture: Secondary | ICD-10-CM | POA: Diagnosis not present

## 2021-06-16 DIAGNOSIS — Z0000 Encounter for general adult medical examination without abnormal findings: Secondary | ICD-10-CM | POA: Diagnosis not present

## 2021-06-16 DIAGNOSIS — Z1211 Encounter for screening for malignant neoplasm of colon: Secondary | ICD-10-CM | POA: Diagnosis not present

## 2021-06-17 DIAGNOSIS — N401 Enlarged prostate with lower urinary tract symptoms: Secondary | ICD-10-CM | POA: Diagnosis not present

## 2021-06-17 DIAGNOSIS — R3912 Poor urinary stream: Secondary | ICD-10-CM | POA: Diagnosis not present

## 2021-06-17 DIAGNOSIS — N5201 Erectile dysfunction due to arterial insufficiency: Secondary | ICD-10-CM | POA: Diagnosis not present

## 2021-06-29 DIAGNOSIS — E785 Hyperlipidemia, unspecified: Secondary | ICD-10-CM | POA: Diagnosis not present

## 2021-06-29 DIAGNOSIS — Z823 Family history of stroke: Secondary | ICD-10-CM | POA: Diagnosis not present

## 2021-06-29 DIAGNOSIS — K219 Gastro-esophageal reflux disease without esophagitis: Secondary | ICD-10-CM | POA: Diagnosis not present

## 2021-06-29 DIAGNOSIS — Z8249 Family history of ischemic heart disease and other diseases of the circulatory system: Secondary | ICD-10-CM | POA: Diagnosis not present

## 2021-06-29 DIAGNOSIS — I2510 Atherosclerotic heart disease of native coronary artery without angina pectoris: Secondary | ICD-10-CM | POA: Diagnosis not present

## 2021-06-29 DIAGNOSIS — I252 Old myocardial infarction: Secondary | ICD-10-CM | POA: Diagnosis not present

## 2021-06-29 DIAGNOSIS — N529 Male erectile dysfunction, unspecified: Secondary | ICD-10-CM | POA: Diagnosis not present

## 2021-06-29 DIAGNOSIS — I10 Essential (primary) hypertension: Secondary | ICD-10-CM | POA: Diagnosis not present

## 2021-06-29 DIAGNOSIS — Z87891 Personal history of nicotine dependence: Secondary | ICD-10-CM | POA: Diagnosis not present

## 2021-06-29 DIAGNOSIS — Z886 Allergy status to analgesic agent status: Secondary | ICD-10-CM | POA: Diagnosis not present

## 2021-12-15 DIAGNOSIS — K219 Gastro-esophageal reflux disease without esophagitis: Secondary | ICD-10-CM | POA: Diagnosis not present

## 2021-12-15 DIAGNOSIS — E785 Hyperlipidemia, unspecified: Secondary | ICD-10-CM | POA: Diagnosis not present

## 2021-12-15 DIAGNOSIS — R7303 Prediabetes: Secondary | ICD-10-CM | POA: Diagnosis not present

## 2021-12-15 DIAGNOSIS — I10 Essential (primary) hypertension: Secondary | ICD-10-CM | POA: Diagnosis not present

## 2021-12-15 DIAGNOSIS — M810 Age-related osteoporosis without current pathological fracture: Secondary | ICD-10-CM | POA: Diagnosis not present

## 2021-12-21 DIAGNOSIS — M1990 Unspecified osteoarthritis, unspecified site: Secondary | ICD-10-CM | POA: Diagnosis not present

## 2021-12-21 DIAGNOSIS — K219 Gastro-esophageal reflux disease without esophagitis: Secondary | ICD-10-CM | POA: Diagnosis not present

## 2021-12-21 DIAGNOSIS — Z008 Encounter for other general examination: Secondary | ICD-10-CM | POA: Diagnosis not present

## 2021-12-21 DIAGNOSIS — I2510 Atherosclerotic heart disease of native coronary artery without angina pectoris: Secondary | ICD-10-CM | POA: Diagnosis not present

## 2021-12-21 DIAGNOSIS — Z7722 Contact with and (suspected) exposure to environmental tobacco smoke (acute) (chronic): Secondary | ICD-10-CM | POA: Diagnosis not present

## 2021-12-21 DIAGNOSIS — E785 Hyperlipidemia, unspecified: Secondary | ICD-10-CM | POA: Diagnosis not present

## 2021-12-21 DIAGNOSIS — N529 Male erectile dysfunction, unspecified: Secondary | ICD-10-CM | POA: Diagnosis not present

## 2021-12-21 DIAGNOSIS — E291 Testicular hypofunction: Secondary | ICD-10-CM | POA: Diagnosis not present

## 2021-12-21 DIAGNOSIS — M810 Age-related osteoporosis without current pathological fracture: Secondary | ICD-10-CM | POA: Diagnosis not present

## 2021-12-21 DIAGNOSIS — G8929 Other chronic pain: Secondary | ICD-10-CM | POA: Diagnosis not present

## 2021-12-21 DIAGNOSIS — I10 Essential (primary) hypertension: Secondary | ICD-10-CM | POA: Diagnosis not present

## 2021-12-21 DIAGNOSIS — I252 Old myocardial infarction: Secondary | ICD-10-CM | POA: Diagnosis not present

## 2021-12-21 DIAGNOSIS — Z79890 Hormone replacement therapy (postmenopausal): Secondary | ICD-10-CM | POA: Diagnosis not present

## 2021-12-22 DIAGNOSIS — E291 Testicular hypofunction: Secondary | ICD-10-CM | POA: Diagnosis not present

## 2021-12-22 DIAGNOSIS — R748 Abnormal levels of other serum enzymes: Secondary | ICD-10-CM | POA: Diagnosis not present

## 2021-12-22 DIAGNOSIS — M1990 Unspecified osteoarthritis, unspecified site: Secondary | ICD-10-CM | POA: Diagnosis not present

## 2021-12-22 DIAGNOSIS — M810 Age-related osteoporosis without current pathological fracture: Secondary | ICD-10-CM | POA: Diagnosis not present

## 2021-12-22 DIAGNOSIS — E785 Hyperlipidemia, unspecified: Secondary | ICD-10-CM | POA: Diagnosis not present

## 2021-12-22 DIAGNOSIS — Z79899 Other long term (current) drug therapy: Secondary | ICD-10-CM | POA: Diagnosis not present

## 2021-12-22 DIAGNOSIS — Z955 Presence of coronary angioplasty implant and graft: Secondary | ICD-10-CM | POA: Diagnosis not present

## 2021-12-22 DIAGNOSIS — I10 Essential (primary) hypertension: Secondary | ICD-10-CM | POA: Diagnosis not present

## 2021-12-22 DIAGNOSIS — Z23 Encounter for immunization: Secondary | ICD-10-CM | POA: Diagnosis not present

## 2021-12-22 DIAGNOSIS — K219 Gastro-esophageal reflux disease without esophagitis: Secondary | ICD-10-CM | POA: Diagnosis not present

## 2021-12-22 DIAGNOSIS — R7303 Prediabetes: Secondary | ICD-10-CM | POA: Diagnosis not present

## 2022-04-12 DIAGNOSIS — D123 Benign neoplasm of transverse colon: Secondary | ICD-10-CM | POA: Diagnosis not present

## 2022-04-12 DIAGNOSIS — K648 Other hemorrhoids: Secondary | ICD-10-CM | POA: Diagnosis not present

## 2022-04-12 DIAGNOSIS — K5730 Diverticulosis of large intestine without perforation or abscess without bleeding: Secondary | ICD-10-CM | POA: Diagnosis not present

## 2022-04-12 DIAGNOSIS — Z09 Encounter for follow-up examination after completed treatment for conditions other than malignant neoplasm: Secondary | ICD-10-CM | POA: Diagnosis not present

## 2022-04-12 DIAGNOSIS — Z86010 Personal history of colonic polyps: Secondary | ICD-10-CM | POA: Diagnosis not present

## 2022-04-14 DIAGNOSIS — D123 Benign neoplasm of transverse colon: Secondary | ICD-10-CM | POA: Diagnosis not present

## 2022-06-16 DIAGNOSIS — Z955 Presence of coronary angioplasty implant and graft: Secondary | ICD-10-CM | POA: Diagnosis not present

## 2022-06-16 DIAGNOSIS — E291 Testicular hypofunction: Secondary | ICD-10-CM | POA: Diagnosis not present

## 2022-06-16 DIAGNOSIS — I10 Essential (primary) hypertension: Secondary | ICD-10-CM | POA: Diagnosis not present

## 2022-06-16 DIAGNOSIS — E785 Hyperlipidemia, unspecified: Secondary | ICD-10-CM | POA: Diagnosis not present

## 2022-06-16 DIAGNOSIS — R748 Abnormal levels of other serum enzymes: Secondary | ICD-10-CM | POA: Diagnosis not present

## 2022-06-16 DIAGNOSIS — R7303 Prediabetes: Secondary | ICD-10-CM | POA: Diagnosis not present

## 2022-06-16 DIAGNOSIS — Z125 Encounter for screening for malignant neoplasm of prostate: Secondary | ICD-10-CM | POA: Diagnosis not present

## 2022-06-16 DIAGNOSIS — K219 Gastro-esophageal reflux disease without esophagitis: Secondary | ICD-10-CM | POA: Diagnosis not present

## 2022-06-16 DIAGNOSIS — M810 Age-related osteoporosis without current pathological fracture: Secondary | ICD-10-CM | POA: Diagnosis not present

## 2022-06-20 DIAGNOSIS — K219 Gastro-esophageal reflux disease without esophagitis: Secondary | ICD-10-CM | POA: Diagnosis not present

## 2022-06-20 DIAGNOSIS — I10 Essential (primary) hypertension: Secondary | ICD-10-CM | POA: Diagnosis not present

## 2022-06-20 DIAGNOSIS — R3912 Poor urinary stream: Secondary | ICD-10-CM | POA: Diagnosis not present

## 2022-06-20 DIAGNOSIS — R7303 Prediabetes: Secondary | ICD-10-CM | POA: Diagnosis not present

## 2022-06-20 DIAGNOSIS — M810 Age-related osteoporosis without current pathological fracture: Secondary | ICD-10-CM | POA: Diagnosis not present

## 2022-06-20 DIAGNOSIS — N5201 Erectile dysfunction due to arterial insufficiency: Secondary | ICD-10-CM | POA: Diagnosis not present

## 2022-06-20 DIAGNOSIS — R748 Abnormal levels of other serum enzymes: Secondary | ICD-10-CM | POA: Diagnosis not present

## 2022-06-20 DIAGNOSIS — N401 Enlarged prostate with lower urinary tract symptoms: Secondary | ICD-10-CM | POA: Diagnosis not present

## 2022-06-20 DIAGNOSIS — Z955 Presence of coronary angioplasty implant and graft: Secondary | ICD-10-CM | POA: Diagnosis not present

## 2022-06-20 DIAGNOSIS — E291 Testicular hypofunction: Secondary | ICD-10-CM | POA: Diagnosis not present

## 2022-06-20 DIAGNOSIS — E785 Hyperlipidemia, unspecified: Secondary | ICD-10-CM | POA: Diagnosis not present

## 2022-06-24 DIAGNOSIS — Z955 Presence of coronary angioplasty implant and graft: Secondary | ICD-10-CM | POA: Diagnosis not present

## 2022-06-24 DIAGNOSIS — L309 Dermatitis, unspecified: Secondary | ICD-10-CM | POA: Diagnosis not present

## 2022-06-24 DIAGNOSIS — R3129 Other microscopic hematuria: Secondary | ICD-10-CM | POA: Diagnosis not present

## 2022-06-24 DIAGNOSIS — M810 Age-related osteoporosis without current pathological fracture: Secondary | ICD-10-CM | POA: Diagnosis not present

## 2022-06-24 DIAGNOSIS — I10 Essential (primary) hypertension: Secondary | ICD-10-CM | POA: Diagnosis not present

## 2022-06-24 DIAGNOSIS — Z23 Encounter for immunization: Secondary | ICD-10-CM | POA: Diagnosis not present

## 2022-06-24 DIAGNOSIS — M1990 Unspecified osteoarthritis, unspecified site: Secondary | ICD-10-CM | POA: Diagnosis not present

## 2022-06-24 DIAGNOSIS — E291 Testicular hypofunction: Secondary | ICD-10-CM | POA: Diagnosis not present

## 2022-06-24 DIAGNOSIS — Z0000 Encounter for general adult medical examination without abnormal findings: Secondary | ICD-10-CM | POA: Diagnosis not present

## 2022-06-24 DIAGNOSIS — K219 Gastro-esophageal reflux disease without esophagitis: Secondary | ICD-10-CM | POA: Diagnosis not present

## 2022-06-24 DIAGNOSIS — R7303 Prediabetes: Secondary | ICD-10-CM | POA: Diagnosis not present

## 2022-06-24 DIAGNOSIS — I700 Atherosclerosis of aorta: Secondary | ICD-10-CM | POA: Diagnosis not present

## 2022-07-07 ENCOUNTER — Ambulatory Visit: Payer: Medicare HMO | Admitting: Cardiology

## 2022-07-07 ENCOUNTER — Encounter: Payer: Self-pay | Admitting: Cardiology

## 2022-07-07 VITALS — BP 128/76 | HR 94 | Temp 98.4°F | Resp 16 | Ht 73.0 in | Wt 212.2 lb

## 2022-07-07 DIAGNOSIS — I10 Essential (primary) hypertension: Secondary | ICD-10-CM | POA: Diagnosis not present

## 2022-07-07 DIAGNOSIS — I2510 Atherosclerotic heart disease of native coronary artery without angina pectoris: Secondary | ICD-10-CM | POA: Diagnosis not present

## 2022-07-07 DIAGNOSIS — E7800 Pure hypercholesterolemia, unspecified: Secondary | ICD-10-CM

## 2022-07-07 NOTE — Progress Notes (Signed)
Primary Physician/Referring:  Janie Morning, DO  Patient ID: James Koch, male    DOB: [birth year], 66 y.o.   MRN: [id number]  Chief Complaint  Patient presents with   Coronary Artery Disease   Hypertension   Hyperlipidemia   Follow-up    1 year   HPI:    HPI: James Koch  is a 66 y.o. male   history of HTN, HLD, hyperglycemia, f/h of premature CAD now presents for annual visit and f/u of  coronary artery disease with H/O myocardial infarction in 1996 and has LAD stent.  He has had a negative nuclear stress test in May 2013. 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    Past Medical History:  Diagnosis Date   BPH (benign prostatic hyperplasia)    CAD (coronary artery disease), native coronary artery 11/08/1995   LAD stent   Coronary artery disease 1997   LAD   Fatty liver 02/22/2007   Noted on [place] Abd   GERD (gastroesophageal reflux disease)    High cholesterol    History of appendicitis    age 75   History of hip fracture    Right   Hypertension    Wears glasses    Past Surgical History:  Procedure Laterality Date   APPENDECTOMY     BONE GRAFT HIP ILIAC CREST Right    COLONOSCOPY     CORONARY ANGIOPLASTY WITH STENT PLACEMENT     LAD   CYSTOSCOPY WITH INSERTION OF UROLIFT N/A 12/07/2018   Procedure: CYSTOSCOPY WITH INSERTION OF UROLIFT;  Surgeon: Festus Aloe, MD;  Location: [place];  Service: Urology;  Laterality: N/A;   HIP FRACTURE SURGERY Right    TOTAL HIP ARTHROPLASTY Right    TOTAL HIP REVISION Right    UPPER GI ENDOSCOPY     Social History   Tobacco Use   Smoking status: Former    Packs/day: 2.00    Years: 29.00    Total pack years: 58.00    Types: Cigarettes    Quit date: 2000    Years since quitting: 23.6   Smokeless tobacco: Never  Substance Use Topics   Alcohol use: Yes    Comment: occ   Marital Status: Married   ROS  Review of Systems  Cardiovascular:   Negative for chest pain, dyspnea on exertion and leg swelling.  Gastrointestinal:  Negative for melena.   Objective  Blood pressure 128/76, pulse 94, temperature 98.4 F (36.9 C), temperature source Temporal, resp. rate 16, height '6\' 1"'  (1.854 m), weight 212 lb 3.2 oz (96.3 kg), SpO2 95 %. Body mass index is 28 kg/m.      07/07/2022   10:14 AM 05/26/2021    9:40 AM 05/25/2020   10:27 AM  Vitals with BMI  Height '6\' 1"'  '6\' 1"'  '6\' 1"'   Weight 212 lbs 3 oz 212 lbs 10 oz 204 lbs  BMI 28 54.56 25.63  Systolic 893 734 287  Diastolic 76 78 72  Pulse 94 76 65    Physical Exam Neck:     Vascular: No carotid bruit or JVD.  Cardiovascular:     Rate and Rhythm: Normal rate and regular rhythm.     Pulses: Normal pulses and intact distal pulses.     Heart sounds: Normal heart sounds. No murmur heard.    No gallop.  Pulmonary:     Effort: Pulmonary effort is normal.  Breath sounds: Normal breath sounds.  Chest:     Comments: Pectus carinatum noted Abdominal:     General: Bowel sounds are normal.     Palpations: Abdomen is soft.  Musculoskeletal:     Right lower leg: No edema.     Left lower leg: No edema.    Radiology: No results found.  Laboratory examination:   External Labs:   Labs 06/16/2022:  A1c 6.2%.  TSH normal at 1.26.  Vitamin D38.6.  Hb 16.3/HCT 47.7, platelets 106 7, normal indicis.  BUN 12, creatinine 1.12, EGFR 68 mL, LFTs normal.  Total cholesterol 134, triglycerides 66, HDL 43, LDL 78.  Medications    Current Outpatient Medications:    aspirin 81 MG chewable tablet, Chew 81 mg by mouth daily., Disp: , Rfl:    Cholecalciferol (VITAMIN D) 50 MCG (2000 UT) CAPS, Take 1 capsule by mouth daily., Disp: , Rfl:    ezetimibe-simvastatin (VYTORIN) 10-40 MG per tablet, Take 1 tablet by mouth every evening. , Disp: , Rfl:    losartan (COZAAR) 100 MG tablet, Take 100 mg by mouth daily. At lunch time, Disp: , Rfl:    omeprazole (PRILOSEC) 20 MG capsule, Take 20 mg by  mouth every evening. , Disp: , Rfl:    Psyllium (METAMUCIL PO), Take by mouth every evening., Disp: , Rfl:    sildenafil (REVATIO) 20 MG tablet, Take 20 mg by mouth once a week., Disp: , Rfl:    Testosterone 30 MG/ACT SOLN, Place 30 mg onto the skin., Disp: , Rfl:    traMADol (ULTRAM) 50 MG tablet, Take by mouth every 6 (six) hours as needed., Disp: , Rfl:   Cardiac Studies:   Echo 04/11/12: Normal echocardiogram. Trace TR, MR.  Exercise sestamibi 04/06/12: Diaphragmatic attenuation artifact. Low risk. Ex 6:21, 10 METs. Fatigue.  EKG:  EKG 07/07/2022: Normal sinus rhythm at rate of 93 beats minute, normal axis, no evidence of ischemia.  Normal EKG.  No significant change from 05/26/2021.   Assessment     ICD-10-CM   1. Coronary artery disease involving native coronary artery of native heart without angina pectoris  I25.10 EKG 12-Lead    PCV ECHOCARDIOGRAM COMPLETE    PCV MYOCARDIAL PERFUSION WO LEXISCAN    2. Hypercholesteremia  E78.00     3. Primary hypertension  I10       Recommendations:   James Koch  is a 66 y.o. male history of HTN, HLD, hyperglycemia, coronary artery disease with history of myocardial infarction 1996 and has had LAD stent, f/h of premature CAD.  He presents for annual visit, remains asymptomatic.  I reviewed his external labs, lipids under excellent control.  Blood pressure is also well controlled.  No change in his EKG.  Patient has had LAD stenting remotely, last stress test was in 2013, I will schedule him for exercise nuclear stress test and an echocardiogram.  Otherwise he is presently doing well, did not make any changes to his medications, I will see him back in a year unless stress test or echocardiogram is abnormal we will see him back sooner.   Adrian Prows, MD, [facility] 07/07/2022, 10:39 AM Office: [phone]

## 2022-08-10 ENCOUNTER — Other Ambulatory Visit: Payer: Medicare HMO

## 2022-08-11 ENCOUNTER — Other Ambulatory Visit: Payer: Medicare HMO

## 2022-08-11 ENCOUNTER — Ambulatory Visit: Payer: Medicare HMO

## 2022-08-11 DIAGNOSIS — I2510 Atherosclerotic heart disease of native coronary artery without angina pectoris: Secondary | ICD-10-CM

## 2022-08-15 NOTE — Progress Notes (Signed)
Clinically low risk topmost intermediate risk, given normal LVEF, by visual inspection, very mild ischemia and good exercise tolerance, continue medical therapy for now.  If the echocardiogram also reveals low EF, then will consider cardiac catheterization.

## 2022-12-20 DIAGNOSIS — I700 Atherosclerosis of aorta: Secondary | ICD-10-CM | POA: Diagnosis not present

## 2022-12-20 DIAGNOSIS — R7303 Prediabetes: Secondary | ICD-10-CM | POA: Diagnosis not present

## 2022-12-20 DIAGNOSIS — E291 Testicular hypofunction: Secondary | ICD-10-CM | POA: Diagnosis not present

## 2022-12-20 DIAGNOSIS — Z0000 Encounter for general adult medical examination without abnormal findings: Secondary | ICD-10-CM | POA: Diagnosis not present

## 2022-12-20 DIAGNOSIS — K219 Gastro-esophageal reflux disease without esophagitis: Secondary | ICD-10-CM | POA: Diagnosis not present

## 2022-12-20 DIAGNOSIS — Z955 Presence of coronary angioplasty implant and graft: Secondary | ICD-10-CM | POA: Diagnosis not present

## 2022-12-27 DIAGNOSIS — E291 Testicular hypofunction: Secondary | ICD-10-CM | POA: Diagnosis not present

## 2022-12-27 DIAGNOSIS — M1990 Unspecified osteoarthritis, unspecified site: Secondary | ICD-10-CM | POA: Diagnosis not present

## 2022-12-27 DIAGNOSIS — R3129 Other microscopic hematuria: Secondary | ICD-10-CM | POA: Diagnosis not present

## 2022-12-27 DIAGNOSIS — Z955 Presence of coronary angioplasty implant and graft: Secondary | ICD-10-CM | POA: Diagnosis not present

## 2022-12-27 DIAGNOSIS — I700 Atherosclerosis of aorta: Secondary | ICD-10-CM | POA: Diagnosis not present

## 2022-12-27 DIAGNOSIS — R7303 Prediabetes: Secondary | ICD-10-CM | POA: Diagnosis not present

## 2022-12-27 DIAGNOSIS — Z125 Encounter for screening for malignant neoplasm of prostate: Secondary | ICD-10-CM | POA: Diagnosis not present

## 2022-12-27 DIAGNOSIS — E785 Hyperlipidemia, unspecified: Secondary | ICD-10-CM | POA: Diagnosis not present

## 2022-12-27 DIAGNOSIS — K219 Gastro-esophageal reflux disease without esophagitis: Secondary | ICD-10-CM | POA: Diagnosis not present

## 2023-02-11 ENCOUNTER — Ambulatory Visit
Admission: EM | Admit: 2023-02-11 | Discharge: 2023-02-11 | Disposition: A | Discharge status: Home / Self Care | Payer: Medicare HMO | Attending: Internal Medicine | Admitting: Internal Medicine

## 2023-02-11 DIAGNOSIS — H18892 Other specified disorders of cornea, left eye: Secondary | ICD-10-CM | POA: Diagnosis not present

## 2023-02-11 MED ORDER — ERYTHROMYCIN 5 MG/GM OP OINT: TOPICAL_OINTMENT | OPHTHALMIC | 0 refills | Status: DC

## 2023-02-11 NOTE — ED Provider Notes (Addendum)
[facility]    CSN: [id number] Arrival date & time: 02/11/23  0804      History   Chief Complaint Chief Complaint  Patient presents with   Foreign Body in Eye    HPI James Koch is a 67 y.o. male.   Patient presents with left eye irritation that started yesterday after mowing the grass.  Patient reports that grass was flying around but he did not have any definitive foreign body that got in his eye.  He denies blurry vision or drainage from the eye.  Reports that his wife washed his eye out with water 3 times yesterday.  Patient wears glasses but does not wear contact lenses.  Patient's blood pressure is elevated but he reports that he has not taken his blood pressure medication yet today.   Foreign Body in Eye    Past Medical History:  Diagnosis Date   BPH (benign prostatic hyperplasia)    CAD (coronary artery disease), native coronary artery 11/08/1995   LAD stent   Coronary artery disease 1997   LAD   Fatty liver 02/22/2007   Noted on [place] Abd   GERD (gastroesophageal reflux disease)    High cholesterol    History of appendicitis    age 67   History of hip fracture    Right   Hypertension    Wears glasses     Patient Active Problem List   Diagnosis Date Noted   CAD (coronary artery disease), native coronary artery 1997    Past Surgical History:  Procedure Laterality Date   APPENDECTOMY     BONE GRAFT HIP ILIAC CREST Right    COLONOSCOPY     CORONARY ANGIOPLASTY WITH STENT PLACEMENT     LAD   CYSTOSCOPY WITH INSERTION OF UROLIFT N/A 12/07/2018   Procedure: CYSTOSCOPY WITH INSERTION OF UROLIFT;  Surgeon: Jerilee FieldEskridge, Matthew, MD;  [facility] [place];  Service: Urology;  Laterality: N/A;   HIP FRACTURE SURGERY Right    TOTAL HIP ARTHROPLASTY Right    TOTAL HIP REVISION Right    UPPER GI ENDOSCOPY         Home Medications    Prior to Admission medications   Medication Sig Start Date End Date Taking? Authorizing Provider   erythromycin ophthalmic ointment Place a 1/2 inch ribbon of ointment into the lower eyelid 4 times daily for 7 days. 02/11/23  Yes Gustavus BryantMound, Avyanna Spada E, FNP  aspirin 81 MG chewable tablet Chew 81 mg by mouth daily.    [provider]  Cholecalciferol (VITAMIN D) 50 MCG (2000 UT) CAPS Take 1 capsule by mouth daily.    [provider]  ezetimibe-simvastatin (VYTORIN) 10-40 MG per tablet Take 1 tablet by mouth every evening.     [provider]  losartan (COZAAR) 100 MG tablet Take 100 mg by mouth daily. At lunch time    [provider]  omeprazole (PRILOSEC) 20 MG capsule Take 20 mg by mouth every evening.     [provider]  Psyllium (METAMUCIL PO) Take by mouth every evening.    [provider]  sildenafil (REVATIO) 20 MG tablet Take 20 mg by mouth once a week.    [provider]  Testosterone 30 MG/ACT SOLN Place 30 mg onto the skin. 04/11/22   [provider]  traMADol (ULTRAM) 50 MG tablet Take by mouth every 6 (six) hours as needed.    [provider]    Family History Family History  Problem  Relation Age of Onset   Stroke Mother    Hypertension Father    Heart attack Father    Hypertension Brother    Heart attack Brother     Social History Social History   Tobacco Use   Smoking status: Former    Packs/day: 2.00    Years: 29.00    Additional pack years: 0.00    Total pack years: 58.00    Types: Cigarettes    Quit date: 2000    Years since quitting: 24.2   Smokeless tobacco: Never  Vaping Use   Vaping Use: Never used  Substance Use Topics   Alcohol use: Yes    Comment: occ   Drug use: No     Allergies   Iodine-131, Morphine, Naproxen sodium, and Shellfish allergy   Review of Systems Review of Systems Per HPI  Physical Exam Triage Vital Signs ED Triage Vitals  Enc Vitals Group     BP 02/11/23 0813 (!) 163/94     Pulse Rate 02/11/23 0813 63     Resp 02/11/23 0813 19     Temp 02/11/23  0813 (!) 97.5 F (36.4 C)     Temp src --      SpO2 02/11/23 0813 98 %     Weight --      Height --      Head Circumference --      Peak Flow --      Pain Score 02/11/23 0812 4     Pain Loc --      Pain Edu? --      Excl. in GC? --    No data found.  Updated Vital Signs BP (!) 163/94   Pulse 63   Temp (!) 97.5 F (36.4 C)   Resp 19   SpO2 98%   Visual Acuity Right Eye Distance: 20/20 Left Eye Distance: 20/20 Bilateral Distance: 20/20 (with corrective lenses)  Right Eye Near:   Left Eye Near:    Bilateral Near:     Physical Exam Constitutional:      General: He is not in acute distress.    Appearance: Normal appearance. He is not toxic-appearing or diaphoretic.  HENT:     Head: Normocephalic and atraumatic.  Eyes:     General: Lids are normal. Lids are everted, no foreign bodies appreciated. Vision grossly intact. Gaze aligned appropriately.     Extraocular Movements: Extraocular movements intact.     Conjunctiva/sclera: Conjunctivae normal.     Pupils: Pupils are equal, round, and reactive to light.     Left eye: No corneal abrasion or fluorescein uptake.     Comments: Patient has mild scleral redness throughout.  No obvious foreign bodies noted.  No fluorescein reuptake noted.  Pulmonary:     Effort: Pulmonary effort is normal.  Neurological:     General: No focal deficit present.     Mental Status: He is alert and oriented to person, place, and time. Mental status is at baseline.  Psychiatric:        Mood and Affect: Mood normal.        Behavior: Behavior normal.        Thought Content: Thought content normal.        Judgment: Judgment normal.      UC Treatments / Results  Labs (all labs ordered are listed, but only abnormal results are displayed) Labs Reviewed - No data to display  EKG   Radiology No results found.  Procedures Procedures (including  critical care time)  Medications Ordered in UC Medications - No data to display  Initial  Impression / Assessment and Plan / UC Course  I have reviewed the triage vital signs and the nursing notes.  Pertinent labs & imaging results that were available during my care of the patient were reviewed by me and considered in my medical decision making (see chart for details).     Patient presenting with left eye irritation after mowing the grass yesterday.  Most likely corneal irritation from foreign body.  Eye was washed out prior to fluorescein stain and no obvious foreign bodies noted.  Fluorescein stain completed with no reuptake notable for ulcer or abrasion but I still suspect some corneal irritation.  Will treat with erythromycin.  Visual acuity appears normal.  Patient advised to follow-up with established eye doctor if symptoms persist or worsen in the next 48 to 72 hours.  Blood pressure elevated but he has not taken his blood pressure medication today.  Patient to take medication as prescribed and monitor blood pressure closely.  No concern for hypertensive urgency. Patient verbalized understanding and was agreeable with plan. Final Clinical Impressions(s) / UC Diagnoses   Final diagnoses:  Corneal irritation of left eye     Discharge Instructions      I have prescribed an antibiotic ointment to help soothe and prevent/treat infection of the eye from irritation.  Please call eye doctor if no improvement in the next 48 hours.    ED Prescriptions     Medication Sig Dispense Auth. Provider   erythromycin ophthalmic ointment Place a 1/2 inch ribbon of ointment into the lower eyelid 4 times daily for 7 days. 3.5 g Gustavus Bryant, [place]      PDMP not reviewed this encounter.   Gustavus Bryant, [place] 02/11/23 0849    Gustavus Bryant, [place] 02/11/23 [phone]

## 2023-02-11 NOTE — Discharge Instructions (Addendum)
I have prescribed an antibiotic ointment to help soothe and prevent/treat infection of the eye from irritation.  Please call eye doctor if no improvement in the next 48 hours.

## 2023-02-11 NOTE — ED Triage Notes (Signed)
Pt presents to [facility] with co of left eye issue since mowing the grass light. Pt has flushed his eye with no improvement.

## 2023-03-08 DIAGNOSIS — M1990 Unspecified osteoarthritis, unspecified site: Secondary | ICD-10-CM | POA: Diagnosis not present

## 2023-03-08 DIAGNOSIS — Z96649 Presence of unspecified artificial hip joint: Secondary | ICD-10-CM | POA: Diagnosis not present

## 2023-03-08 DIAGNOSIS — I10 Essential (primary) hypertension: Secondary | ICD-10-CM | POA: Diagnosis not present

## 2023-03-08 DIAGNOSIS — M810 Age-related osteoporosis without current pathological fracture: Secondary | ICD-10-CM | POA: Diagnosis not present

## 2023-03-08 DIAGNOSIS — I2510 Atherosclerotic heart disease of native coronary artery without angina pectoris: Secondary | ICD-10-CM | POA: Diagnosis not present

## 2023-03-08 DIAGNOSIS — Z87891 Personal history of nicotine dependence: Secondary | ICD-10-CM | POA: Diagnosis not present

## 2023-03-08 DIAGNOSIS — K219 Gastro-esophageal reflux disease without esophagitis: Secondary | ICD-10-CM | POA: Diagnosis not present

## 2023-03-08 DIAGNOSIS — E785 Hyperlipidemia, unspecified: Secondary | ICD-10-CM | POA: Diagnosis not present

## 2023-03-08 DIAGNOSIS — N529 Male erectile dysfunction, unspecified: Secondary | ICD-10-CM | POA: Diagnosis not present

## 2023-03-08 DIAGNOSIS — I252 Old myocardial infarction: Secondary | ICD-10-CM | POA: Diagnosis not present

## 2023-03-08 DIAGNOSIS — I700 Atherosclerosis of aorta: Secondary | ICD-10-CM | POA: Diagnosis not present

## 2023-03-08 DIAGNOSIS — Z8249 Family history of ischemic heart disease and other diseases of the circulatory system: Secondary | ICD-10-CM | POA: Diagnosis not present

## 2023-06-15 DIAGNOSIS — N5201 Erectile dysfunction due to arterial insufficiency: Secondary | ICD-10-CM | POA: Diagnosis not present

## 2023-06-15 DIAGNOSIS — N401 Enlarged prostate with lower urinary tract symptoms: Secondary | ICD-10-CM | POA: Diagnosis not present

## 2023-06-15 DIAGNOSIS — R3912 Poor urinary stream: Secondary | ICD-10-CM | POA: Diagnosis not present

## 2023-06-20 DIAGNOSIS — E291 Testicular hypofunction: Secondary | ICD-10-CM | POA: Diagnosis not present

## 2023-06-20 DIAGNOSIS — E785 Hyperlipidemia, unspecified: Secondary | ICD-10-CM | POA: Diagnosis not present

## 2023-06-20 DIAGNOSIS — K219 Gastro-esophageal reflux disease without esophagitis: Secondary | ICD-10-CM | POA: Diagnosis not present

## 2023-06-20 DIAGNOSIS — Z955 Presence of coronary angioplasty implant and graft: Secondary | ICD-10-CM | POA: Diagnosis not present

## 2023-06-20 DIAGNOSIS — Z125 Encounter for screening for malignant neoplasm of prostate: Secondary | ICD-10-CM | POA: Diagnosis not present

## 2023-06-20 DIAGNOSIS — I700 Atherosclerosis of aorta: Secondary | ICD-10-CM | POA: Diagnosis not present

## 2023-06-20 DIAGNOSIS — R7303 Prediabetes: Secondary | ICD-10-CM | POA: Diagnosis not present

## 2023-06-27 DIAGNOSIS — K219 Gastro-esophageal reflux disease without esophagitis: Secondary | ICD-10-CM | POA: Diagnosis not present

## 2023-06-27 DIAGNOSIS — Z0000 Encounter for general adult medical examination without abnormal findings: Secondary | ICD-10-CM | POA: Diagnosis not present

## 2023-06-27 DIAGNOSIS — Z955 Presence of coronary angioplasty implant and graft: Secondary | ICD-10-CM | POA: Diagnosis not present

## 2023-06-27 DIAGNOSIS — Z125 Encounter for screening for malignant neoplasm of prostate: Secondary | ICD-10-CM | POA: Diagnosis not present

## 2023-06-27 DIAGNOSIS — E291 Testicular hypofunction: Secondary | ICD-10-CM | POA: Diagnosis not present

## 2023-06-27 DIAGNOSIS — E785 Hyperlipidemia, unspecified: Secondary | ICD-10-CM | POA: Diagnosis not present

## 2023-06-27 DIAGNOSIS — R7303 Prediabetes: Secondary | ICD-10-CM | POA: Diagnosis not present

## 2023-06-27 DIAGNOSIS — I700 Atherosclerosis of aorta: Secondary | ICD-10-CM | POA: Diagnosis not present

## 2023-07-07 ENCOUNTER — Ambulatory Visit: Payer: Medicare HMO | Admitting: Cardiology

## 2023-08-10 ENCOUNTER — Ambulatory Visit: Payer: Medicare HMO | Admitting: Cardiology

## 2023-09-26 ENCOUNTER — Encounter: Payer: Self-pay | Admitting: Cardiology

## 2023-09-26 ENCOUNTER — Ambulatory Visit: Payer: Medicare HMO | Attending: Cardiology | Admitting: Cardiology

## 2023-09-26 VITALS — BP 132/82 | HR 88 | Resp 16 | Ht 73.0 in | Wt 217.8 lb

## 2023-09-26 DIAGNOSIS — E7800 Pure hypercholesterolemia, unspecified: Secondary | ICD-10-CM | POA: Diagnosis not present

## 2023-09-26 DIAGNOSIS — I2510 Atherosclerotic heart disease of native coronary artery without angina pectoris: Secondary | ICD-10-CM

## 2023-09-26 DIAGNOSIS — I10 Essential (primary) hypertension: Secondary | ICD-10-CM

## 2023-09-26 NOTE — Patient Instructions (Signed)

## 2023-09-26 NOTE — Progress Notes (Signed)
Cardiology Office Note:  .   Date:  09/26/2023  ID:  Honor Junes, DOB 1956-10-04, MRN [id number] PCP: Irena Reichmann, DO  [facility] Providers Cardiologist:  Yates Decamp, MD   Chief Complaint  Patient presents with   Coronary artery disease involving native coronary artery of   Hypertension   Hyperlipidemia   Follow-up      History of Present Illness: .   James Koch is a 67 y.o. Caucasian male  patient with history of HTN, HLD, hyperglycemia, f/h of premature CAD presents for annual visit and f/u of  coronary artery disease with H/O myocardial infarction in 1996 and has LAD stent.  He has had a negative nuclear stress test in May 2013. 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   Discussed the use of AI scribe software for clinical note transcription with the patient, who gave verbal consent to proceed.  History of Present Illness   The patient, with a history of prediabetes and allergies, presents for a routine follow-up. He reports no chest pain, shortness of breath, or side effects from his current medications. 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 He also works in the yard frequently, but does not have a planned exercise program. The patient's mother-in-law has Alzheimer's and has been placed in a care facility, which has allowed the patient's wife to spend more time with her. The patient also reports seasonal allergies, which are particularly severe in the fall.       Review of Systems  Cardiovascular:  Negative for chest pain, dyspnea on exertion and leg swelling.   Labs   External Labs:  Labs 06/28/2023:  A1c 6.3%.  Serum glucose 98 mg, BUN 17, creatinine 1.26, EGFR 63 mL, potassium 4.7, LFTs normal.  Hb 15.8/HCT 47.1, platelets 214, normal indicis.  Total cholesterol 127, triglycerides 92, HDL 40, LDL 69.  Physical Exam:   VS:  BP 132/82 (BP Location: Left Arm,  Patient Position: Sitting, Cuff Size: Normal)   Pulse 88   Resp 16   Ht 6\' 1"  (1.854 m)   Wt 217 lb 12.8 oz (98.8 kg)   SpO2 97%   BMI 28.74 kg/m    Wt Readings from Last 3 Encounters:  09/26/23 217 lb 12.8 oz (98.8 kg)  07/07/22 212 lb 3.2 oz (96.3 kg)  05/26/21 212 lb 9.6 oz (96.4 kg)     Physical Exam Neck:     Vascular: No carotid bruit or JVD.  Cardiovascular:     Rate and Rhythm: Normal rate and regular rhythm.     Pulses: Intact distal pulses.     Heart sounds: Normal heart sounds. No murmur heard.    No gallop.  Pulmonary:     Effort: Pulmonary effort is normal.     Breath sounds: Normal breath sounds.  Abdominal:     General: Bowel sounds are normal.     Palpations: Abdomen is soft.  Musculoskeletal:     Right lower leg: No edema.     Left lower leg: No edema.     Studies Reviewed: .    Echocardiogram 08/11/2022: Normal LV systolic function with EF 59%. Left ventricle cavity is normal in size. Normal left ventricular wall thickness. Normal global wall motion. Normal diastolic filling pattern. No significant valvular abnormality. Normal right atrial pressure. No significant disease compared to previous study in 2013.  Exercise nuclear  stress test 08/11/2022: Normal ECG stress. The patient exercised for 8 minutes and 38 seconds of a Bruce protocol, achieving approximately 10.16 METs.  Normal BP response. Good effort.  Myocardial perfusion is abnormal. There is a small sized reversible mild defect in the inferior region.  Overall LV systolic function is abnormal with inferior hypokinesis. Calculated Stress LV EF: 40%, but visually appears normal.   No previous exam available for comparison. Low risk study.    EKG:    EKG Interpretation Date/Time:  Tuesday September 26 2023 10:08:40 EST Ventricular Rate:  89 PR Interval:  146 QRS Duration:  76 QT Interval:  368 QTC Calculation: 447 R Axis:   57  Text Interpretation: EKG 09/26/2023: Normal sinus rhythm  at rate of 89 bpm, normal EKG.  No significant change from 12/07/2018. Confirmed by Delrae Rend [phone]) on 09/26/2023 10:18:33 AM    EKG 07/07/2022: Normal sinus rhythm at rate of 93 beats minute, normal axis, no evidence of ischemia. Normal EKG. No significant change from 05/26/2021.   Medications and allergies    Allergies  Allergen Reactions   Iodine-131 Other (See Comments)    States whelped up after taking   Morphine Other (See Comments)    Hallucinations   Naproxen Sodium Itching    Pt takes aspirin at home   Shellfish Allergy Hives    Current Meds  Medication Sig   aspirin 81 MG chewable tablet Chew 81 mg by mouth daily.   Cholecalciferol (VITAMIN D) 50 MCG (2000 UT) CAPS Take 1 capsule by mouth daily.   ezetimibe-simvastatin (VYTORIN) 10-40 MG per tablet Take 1 tablet by mouth every evening.    losartan (COZAAR) 100 MG tablet Take 100 mg by mouth daily. At lunch time   omeprazole (PRILOSEC) 20 MG capsule Take 20 mg by mouth every evening.    Psyllium (METAMUCIL PO) Take by mouth every evening.   sildenafil (REVATIO) 20 MG tablet Take 20 mg by mouth once a week.   Testosterone 30 MG/ACT SOLN Place 30 mg onto the skin.   traMADol (ULTRAM) 50 MG tablet Take by mouth every 6 (six) hours as needed.     ASSESSMENT AND PLAN: .      ICD-10-CM   1. Coronary artery disease involving native coronary artery of native heart without angina pectoris  I25.10 EKG 12-Lead    2. Hypercholesteremia  E78.00     3. Primary hypertension  I10       There are no diagnoses linked to this encounter.  Assessment and Plan    CAD No chest pain or shortness of breath. Tolerating current medications well, this includes losartan 100 mg daily, aspirin 81 mg daily.  He is not on a beta-blocker due to low heart rate.  Recent echocardiogram and nuclear stress test (September 2023) showed normal heart function and no significant blockages. Small defect in the inferior region, but not concerning.   Low risk. -Continue current medications. -Encouraged to incorporate a regular exercise program at least three times a week.  Prediabetes A1c at 6.3% as of August 2024. -Continue current lifestyle modifications. -Encouraged to incorporate a regular exercise program at least three times a week.  Hypercholesterolemia Presently lipids are well-controlled on present dose of Vytorin 10/40 mg daily.  Continue the same.  Primary hypertension: Blood pressure again is well-controlled on losartan, no changes in the medications were done today.  I reviewed his external labs, renal function is normal.  Office visit in a year or sooner if problems.  General Health Maintenance -Continue daily baby aspirin. -Return for follow-up in one year.    Signed,  Yates Decamp, MD, [facility] 09/26/2023, 10:20 AM [facility] [address] [place], [place] [address] Phone: [phone]. Fax:  [phone]

## 2023-12-13 DIAGNOSIS — N529 Male erectile dysfunction, unspecified: Secondary | ICD-10-CM | POA: Diagnosis not present

## 2023-12-13 DIAGNOSIS — I129 Hypertensive chronic kidney disease with stage 1 through stage 4 chronic kidney disease, or unspecified chronic kidney disease: Secondary | ICD-10-CM | POA: Diagnosis not present

## 2023-12-13 DIAGNOSIS — E785 Hyperlipidemia, unspecified: Secondary | ICD-10-CM | POA: Diagnosis not present

## 2023-12-13 DIAGNOSIS — I700 Atherosclerosis of aorta: Secondary | ICD-10-CM | POA: Diagnosis not present

## 2023-12-13 DIAGNOSIS — Z008 Encounter for other general examination: Secondary | ICD-10-CM | POA: Diagnosis not present

## 2023-12-13 DIAGNOSIS — M1990 Unspecified osteoarthritis, unspecified site: Secondary | ICD-10-CM | POA: Diagnosis not present

## 2023-12-13 DIAGNOSIS — K5900 Constipation, unspecified: Secondary | ICD-10-CM | POA: Diagnosis not present

## 2023-12-13 DIAGNOSIS — K219 Gastro-esophageal reflux disease without esophagitis: Secondary | ICD-10-CM | POA: Diagnosis not present

## 2023-12-13 DIAGNOSIS — Z87891 Personal history of nicotine dependence: Secondary | ICD-10-CM | POA: Diagnosis not present

## 2023-12-13 DIAGNOSIS — I2510 Atherosclerotic heart disease of native coronary artery without angina pectoris: Secondary | ICD-10-CM | POA: Diagnosis not present

## 2023-12-13 DIAGNOSIS — I252 Old myocardial infarction: Secondary | ICD-10-CM | POA: Diagnosis not present

## 2023-12-13 DIAGNOSIS — N182 Chronic kidney disease, stage 2 (mild): Secondary | ICD-10-CM | POA: Diagnosis not present

## 2023-12-13 DIAGNOSIS — Z8249 Family history of ischemic heart disease and other diseases of the circulatory system: Secondary | ICD-10-CM | POA: Diagnosis not present

## 2023-12-21 DIAGNOSIS — K219 Gastro-esophageal reflux disease without esophagitis: Secondary | ICD-10-CM | POA: Diagnosis not present

## 2023-12-21 DIAGNOSIS — E291 Testicular hypofunction: Secondary | ICD-10-CM | POA: Diagnosis not present

## 2023-12-21 DIAGNOSIS — E785 Hyperlipidemia, unspecified: Secondary | ICD-10-CM | POA: Diagnosis not present

## 2023-12-21 DIAGNOSIS — R7303 Prediabetes: Secondary | ICD-10-CM | POA: Diagnosis not present

## 2023-12-28 DIAGNOSIS — E291 Testicular hypofunction: Secondary | ICD-10-CM | POA: Diagnosis not present

## 2023-12-28 DIAGNOSIS — R7303 Prediabetes: Secondary | ICD-10-CM | POA: Diagnosis not present

## 2023-12-28 DIAGNOSIS — E785 Hyperlipidemia, unspecified: Secondary | ICD-10-CM | POA: Diagnosis not present

## 2023-12-28 DIAGNOSIS — Z79899 Other long term (current) drug therapy: Secondary | ICD-10-CM | POA: Diagnosis not present

## 2023-12-28 DIAGNOSIS — R946 Abnormal results of thyroid function studies: Secondary | ICD-10-CM | POA: Diagnosis not present

## 2023-12-28 DIAGNOSIS — K219 Gastro-esophageal reflux disease without esophagitis: Secondary | ICD-10-CM | POA: Diagnosis not present

## 2024-06-20 DIAGNOSIS — E785 Hyperlipidemia, unspecified: Secondary | ICD-10-CM | POA: Diagnosis not present

## 2024-06-20 DIAGNOSIS — R946 Abnormal results of thyroid function studies: Secondary | ICD-10-CM | POA: Diagnosis not present

## 2024-06-20 DIAGNOSIS — R7303 Prediabetes: Secondary | ICD-10-CM | POA: Diagnosis not present

## 2024-06-20 DIAGNOSIS — Z125 Encounter for screening for malignant neoplasm of prostate: Secondary | ICD-10-CM | POA: Diagnosis not present

## 2024-06-20 DIAGNOSIS — K219 Gastro-esophageal reflux disease without esophagitis: Secondary | ICD-10-CM | POA: Diagnosis not present

## 2024-06-20 DIAGNOSIS — E291 Testicular hypofunction: Secondary | ICD-10-CM | POA: Diagnosis not present

## 2024-06-20 DIAGNOSIS — Z79899 Other long term (current) drug therapy: Secondary | ICD-10-CM | POA: Diagnosis not present

## 2024-06-27 DIAGNOSIS — Z955 Presence of coronary angioplasty implant and graft: Secondary | ICD-10-CM | POA: Diagnosis not present

## 2024-06-27 DIAGNOSIS — R7303 Prediabetes: Secondary | ICD-10-CM | POA: Diagnosis not present

## 2024-06-27 DIAGNOSIS — Z0000 Encounter for general adult medical examination without abnormal findings: Secondary | ICD-10-CM | POA: Diagnosis not present

## 2024-06-27 DIAGNOSIS — Z79899 Other long term (current) drug therapy: Secondary | ICD-10-CM | POA: Diagnosis not present

## 2024-06-27 DIAGNOSIS — E291 Testicular hypofunction: Secondary | ICD-10-CM | POA: Diagnosis not present

## 2024-06-27 DIAGNOSIS — K219 Gastro-esophageal reflux disease without esophagitis: Secondary | ICD-10-CM | POA: Diagnosis not present

## 2024-06-27 DIAGNOSIS — M169 Osteoarthritis of hip, unspecified: Secondary | ICD-10-CM | POA: Diagnosis not present

## 2024-06-27 DIAGNOSIS — E785 Hyperlipidemia, unspecified: Secondary | ICD-10-CM | POA: Diagnosis not present

## 2024-06-27 DIAGNOSIS — I700 Atherosclerosis of aorta: Secondary | ICD-10-CM | POA: Diagnosis not present

## 2024-08-28 DIAGNOSIS — N5203 Combined arterial insufficiency and corporo-venous occlusive erectile dysfunction: Secondary | ICD-10-CM | POA: Diagnosis not present

## 2024-08-28 DIAGNOSIS — R399 Unspecified symptoms and signs involving the genitourinary system: Secondary | ICD-10-CM | POA: Diagnosis not present

## 2024-08-28 DIAGNOSIS — R3912 Poor urinary stream: Secondary | ICD-10-CM | POA: Diagnosis not present

## 2024-12-11 ENCOUNTER — Encounter: Payer: Self-pay | Admitting: Cardiology

## 2024-12-11 ENCOUNTER — Ambulatory Visit: Admitting: Cardiology

## 2024-12-11 VITALS — BP 110/70 | HR 87 | Ht 73.0 in | Wt 225.9 lb

## 2024-12-11 DIAGNOSIS — I10 Essential (primary) hypertension: Secondary | ICD-10-CM

## 2024-12-11 DIAGNOSIS — I25118 Atherosclerotic heart disease of native coronary artery with other forms of angina pectoris: Secondary | ICD-10-CM

## 2024-12-11 DIAGNOSIS — E7800 Pure hypercholesterolemia, unspecified: Secondary | ICD-10-CM | POA: Diagnosis not present

## 2024-12-11 DIAGNOSIS — R0609 Other forms of dyspnea: Secondary | ICD-10-CM

## 2024-12-11 NOTE — Progress Notes (Unsigned)
 " Cardiology Office Note:  .   Date:  12/11/2024  ID:  James Koch, DOB October 02, 1956, MRN [id number] PCP: Gerome Brunet, DO  [facility] Providers Cardiologist:  Gordy Bergamo, MD { Click to update primary MD,subspecialty MD or APP then REFRESH:1}  History of Present Illness: .   James Koch is a 69 y.o.  Caucasian male  patient with history of HTN, HLD, hyperglycemia, f/h of premature CAD, H/O myocardial infarction in 1996 and has LAD stent.  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  He has a normal echocardiogram on 08/11/2022 and a nuclear stress test had revealed excellent exercise tolerance with a small inferior ischemia.    Discussed the use of AI scribe software for clinical note transcription with the patient, who gave verbal consent to proceed.  History of Present Illness   Cardiac Studies relevent.    Echocardiogram 08/11/2022: Normal LV systolic function with EF 59%. Left ventricle cavity is normal in size. Normal left ventricular wall thickness. Normal global wall motion. Normal diastolic filling pattern.  Exercise nuclear stress test 08/11/2022: Normal ECG stress. The patient exercised for 8 minutes and 38 seconds of a Bruce protocol, achieving approximately 10.16 METs.   There is a small sized reversible mild defect in the inferior region.  Overall LV systolic function is abnormal with inferior hypokinesis. Calculated Stress LV EF: 40%  EKG:   EKG Interpretation Date/Time:  Wednesday December 11 2024 09:26:58 EST Ventricular Rate:  87 PR Interval:  146 QRS Duration:  72 QT Interval:  354 QTC Calculation: 425 R Axis:   58  Text Interpretation: EKG 06/10/2025: Normal sinus rhythm at rate of 87 bpm, normal EKG.  No change from 09/26/2023. Confirmed by Bergamo Gordy [phone]) on 12/11/2024 9:33:13 AM  Labs   Care everywhere/Faxed External Labs:  Labs 06/26/2024:  Total cholesterol 129, triglycerides 75, HDL  46, LDL 68.  Hb 14.6/HCT 43.8, platelets 119, normal indicis.  Serum glucose 88 mg, BUN 12, creatinine 0.99, eGFR 83 mL, potassium 4.4, LFTs normal.  A1c 6.3%.  TSH normal at 1.630.  ROS  Review of Systems  Cardiovascular:  Positive for dyspnea on exertion. Negative for chest pain and leg swelling.   Physical Exam:   VS:  BP 110/70   Pulse 87   Ht 6' 1 (1.854 m)   Wt 225 lb 14.4 oz (102.5 kg)   SpO2 95%   BMI 29.80 kg/m    Wt Readings from Last 3 Encounters:  12/11/24 225 lb 14.4 oz (102.5 kg)  09/26/23 217 lb 12.8 oz (98.8 kg)  07/07/22 212 lb 3.2 oz (96.3 kg)    BP Readings from Last 3 Encounters:  12/11/24 110/70  09/26/23 132/82  02/11/23 (!) 163/94   Physical Exam Neck:     Vascular: No carotid bruit or JVD.  Cardiovascular:     Rate and Rhythm: Normal rate and regular rhythm.     Pulses: Intact distal pulses.     Heart sounds: Normal heart sounds. No murmur heard.    No gallop.  Pulmonary:     Effort: Pulmonary effort is normal.     Breath sounds: Normal breath sounds.  Abdominal:     General: Bowel sounds are normal.     Palpations: Abdomen is soft.  Musculoskeletal:     Right lower leg: No edema.     Left lower leg: No edema.     ASSESSMENT AND  PLAN: .      ICD-10-CM   1. Coronary artery disease involving native coronary artery of native heart without angina pectoris  I25.10 EKG 12-Lead    2. Hypercholesteremia  E78.00 EKG 12-Lead    3. Primary hypertension  I10 EKG 12-Lead     Assessment & Plan    Follow up: 6 months for dyspnea, CAD, Hyperlipidemia  Signed,  Gordy Bergamo, MD, [facility] 12/11/2024, 9:35 AM [facility] [address] [place], [place] [address] Phone: [phone]. Fax:  [phone]  "

## 2024-12-11 NOTE — Patient Instructions (Addendum)
 Medication Instructions:  Your physician recommends that you continue on your current medications as directed. Please refer to the Current Medication list given to you today.  *If you need a refill on your cardiac medications before your next appointment, please call your pharmacy*  Follow-Up: At [facility], you and your health needs are our priority.  As part of our continuing mission to provide you with exceptional heart care, our providers are all part of one team.  This team includes your primary Cardiologist (physician) and Advanced Practice Providers or APPs (Physician Assistants and Nurse Practitioners) who all work together to provide you with the care you need, when you need it.  Your next appointment:   6 month(s)  Provider:   Gordy Bergamo, MD            We recommend signing up for the patient portal called MyChart.  Patients are able to view lab/test results, encounter notes, upcoming appointments, etc.  Non-urgent messages can be sent to your provider as well, go to [email or web address].
